# Patient Record
Sex: Female | Born: 1945 | Race: White | Hispanic: No | Marital: Married | Smoking: Never smoker
Health system: Southern US, Community
[De-identification: ages and names within clinical notes are randomized; demographics above are authoritative.]

## PROBLEM LIST (undated history)

## (undated) DIAGNOSIS — R509 Fever, unspecified: Secondary | ICD-10-CM

## (undated) DIAGNOSIS — E7800 Pure hypercholesterolemia, unspecified: Secondary | ICD-10-CM

## (undated) DIAGNOSIS — D72829 Elevated white blood cell count, unspecified: Secondary | ICD-10-CM

## (undated) DIAGNOSIS — R6883 Chills (without fever): Secondary | ICD-10-CM

## (undated) DIAGNOSIS — K922 Gastrointestinal hemorrhage, unspecified: Secondary | ICD-10-CM

## (undated) DIAGNOSIS — N63 Unspecified lump in unspecified breast: Secondary | ICD-10-CM

## (undated) DIAGNOSIS — N61 Mastitis without abscess: Secondary | ICD-10-CM

## (undated) DIAGNOSIS — A0472 Enterocolitis due to Clostridium difficile, not specified as recurrent: Secondary | ICD-10-CM

## (undated) DIAGNOSIS — R197 Diarrhea, unspecified: Secondary | ICD-10-CM

## (undated) DIAGNOSIS — I10 Essential (primary) hypertension: Secondary | ICD-10-CM

## (undated) DIAGNOSIS — T148XXA Other injury of unspecified body region, initial encounter: Secondary | ICD-10-CM

## (undated) DIAGNOSIS — Z9289 Personal history of other medical treatment: Secondary | ICD-10-CM

## (undated) HISTORY — PX: OTHER SURGICAL HISTORY: SHX169

## (undated) HISTORY — DX: Unspecified lump in unspecified breast: N63.0

## (undated) HISTORY — DX: Chills (without fever): R68.83

## (undated) HISTORY — DX: Pure hypercholesterolemia, unspecified: E78.00

## (undated) HISTORY — DX: Personal history of other medical treatment: Z92.89

## (undated) HISTORY — DX: Elevated white blood cell count, unspecified: D72.829

## (undated) HISTORY — DX: Essential (primary) hypertension: I10

## (undated) HISTORY — DX: Enterocolitis due to Clostridium difficile, not specified as recurrent: A04.72

## (undated) HISTORY — DX: Fever, unspecified: R50.9

## (undated) HISTORY — DX: Mastitis without abscess: N61.0

## (undated) HISTORY — DX: Diarrhea, unspecified: R19.7

## (undated) HISTORY — PX: ROTATOR CUFF REPAIR: SHX139

## (undated) HISTORY — DX: Other injury of unspecified body region, initial encounter: T14.8XXA

---

## 1999-04-01 ENCOUNTER — Other Ambulatory Visit: Admission: RE | Admit: 1999-04-01 | Discharge: 1999-04-01 | Payer: Self-pay | Admitting: Gynecology

## 2000-04-06 ENCOUNTER — Other Ambulatory Visit: Admission: RE | Admit: 2000-04-06 | Discharge: 2000-04-06 | Payer: Self-pay | Admitting: Gynecology

## 2001-04-26 ENCOUNTER — Inpatient Hospital Stay (HOSPITAL_COMMUNITY): Admission: EM | Admit: 2001-04-26 | Discharge: 2001-04-29 | Payer: Self-pay | Admitting: Emergency Medicine

## 2001-05-06 ENCOUNTER — Other Ambulatory Visit: Admission: RE | Admit: 2001-05-06 | Discharge: 2001-05-06 | Payer: Self-pay | Admitting: Gynecology

## 2002-05-02 ENCOUNTER — Other Ambulatory Visit: Admission: RE | Admit: 2002-05-02 | Discharge: 2002-05-02 | Payer: Self-pay | Admitting: Gynecology

## 2003-05-23 ENCOUNTER — Other Ambulatory Visit: Admission: RE | Admit: 2003-05-23 | Discharge: 2003-05-23 | Payer: Self-pay | Admitting: Gynecology

## 2003-07-07 ENCOUNTER — Ambulatory Visit (HOSPITAL_COMMUNITY): Admission: RE | Admit: 2003-07-07 | Discharge: 2003-07-07 | Payer: Self-pay | Admitting: Gynecology

## 2003-08-23 ENCOUNTER — Ambulatory Visit (HOSPITAL_COMMUNITY): Admission: RE | Admit: 2003-08-23 | Discharge: 2003-08-23 | Payer: Self-pay | Admitting: Gynecology

## 2004-01-25 ENCOUNTER — Ambulatory Visit (HOSPITAL_COMMUNITY): Admission: RE | Admit: 2004-01-25 | Discharge: 2004-01-25 | Payer: Self-pay | Admitting: Gynecology

## 2004-05-23 ENCOUNTER — Other Ambulatory Visit: Admission: RE | Admit: 2004-05-23 | Discharge: 2004-05-23 | Payer: Self-pay | Admitting: Gynecology

## 2004-06-24 ENCOUNTER — Ambulatory Visit (HOSPITAL_COMMUNITY): Admission: RE | Admit: 2004-06-24 | Discharge: 2004-06-24 | Payer: Self-pay | Admitting: Gynecology

## 2005-06-02 ENCOUNTER — Other Ambulatory Visit: Admission: RE | Admit: 2005-06-02 | Discharge: 2005-06-02 | Payer: Self-pay | Admitting: Gynecology

## 2005-06-12 ENCOUNTER — Ambulatory Visit (HOSPITAL_COMMUNITY): Admission: RE | Admit: 2005-06-12 | Discharge: 2005-06-12 | Payer: Self-pay | Admitting: Gynecology

## 2005-08-18 ENCOUNTER — Ambulatory Visit (HOSPITAL_COMMUNITY): Admission: RE | Admit: 2005-08-18 | Discharge: 2005-08-18 | Payer: Self-pay | Admitting: Gynecology

## 2006-01-19 ENCOUNTER — Ambulatory Visit (HOSPITAL_COMMUNITY): Admission: RE | Admit: 2006-01-19 | Discharge: 2006-01-19 | Payer: Self-pay | Admitting: Gynecology

## 2006-06-04 ENCOUNTER — Other Ambulatory Visit: Admission: RE | Admit: 2006-06-04 | Discharge: 2006-06-04 | Payer: Self-pay | Admitting: Gynecology

## 2006-07-30 ENCOUNTER — Ambulatory Visit (HOSPITAL_COMMUNITY): Admission: RE | Admit: 2006-07-30 | Discharge: 2006-07-30 | Payer: Self-pay | Admitting: Gynecology

## 2007-02-17 ENCOUNTER — Emergency Department: Payer: Self-pay | Admitting: Emergency Medicine

## 2007-05-14 ENCOUNTER — Ambulatory Visit (HOSPITAL_BASED_OUTPATIENT_CLINIC_OR_DEPARTMENT_OTHER): Admission: RE | Admit: 2007-05-14 | Discharge: 2007-05-14 | Payer: Self-pay | Admitting: *Deleted

## 2007-05-14 DIAGNOSIS — T148XXA Other injury of unspecified body region, initial encounter: Secondary | ICD-10-CM

## 2007-05-14 HISTORY — DX: Other injury of unspecified body region, initial encounter: T14.8XXA

## 2007-07-08 ENCOUNTER — Other Ambulatory Visit: Admission: RE | Admit: 2007-07-08 | Discharge: 2007-07-08 | Payer: Self-pay | Admitting: Gynecology

## 2010-01-03 ENCOUNTER — Ambulatory Visit: Payer: Self-pay | Admitting: Cardiology

## 2010-03-10 ENCOUNTER — Encounter: Payer: Self-pay | Admitting: Gynecology

## 2010-06-14 ENCOUNTER — Encounter: Payer: Self-pay | Admitting: *Deleted

## 2010-06-14 DIAGNOSIS — E7800 Pure hypercholesterolemia, unspecified: Secondary | ICD-10-CM | POA: Insufficient documentation

## 2010-06-14 DIAGNOSIS — M810 Age-related osteoporosis without current pathological fracture: Secondary | ICD-10-CM | POA: Insufficient documentation

## 2010-06-19 ENCOUNTER — Telehealth: Payer: Self-pay | Admitting: *Deleted

## 2010-06-19 ENCOUNTER — Other Ambulatory Visit (INDEPENDENT_AMBULATORY_CARE_PROVIDER_SITE_OTHER): Payer: Medicare Other | Admitting: *Deleted

## 2010-06-19 ENCOUNTER — Encounter: Payer: Self-pay | Admitting: Cardiology

## 2010-06-19 ENCOUNTER — Ambulatory Visit (INDEPENDENT_AMBULATORY_CARE_PROVIDER_SITE_OTHER): Payer: Medicare Other | Admitting: Cardiology

## 2010-06-19 VITALS — BP 120/70 | HR 66 | Wt 134.0 lb

## 2010-06-19 DIAGNOSIS — E7800 Pure hypercholesterolemia, unspecified: Secondary | ICD-10-CM

## 2010-06-19 LAB — BASIC METABOLIC PANEL
BUN: 13 mg/dL (ref 6–23)
CO2: 28 mEq/L (ref 19–32)
Chloride: 105 mEq/L (ref 96–112)
Creatinine, Ser: 0.5 mg/dL (ref 0.4–1.2)
Glucose, Bld: 86 mg/dL (ref 70–99)
Potassium: 4 mEq/L (ref 3.5–5.1)

## 2010-06-19 LAB — LIPID PANEL
Cholesterol: 154 mg/dL (ref 0–200)
HDL: 63.6 mg/dL (ref >39.00)
LDL Cholesterol: 80 mg/dL (ref 0–99)
Total CHOL/HDL Ratio: 2
Triglycerides: 53 mg/dL (ref 0.0–149.0)
VLDL: 10.6 mg/dL (ref 0.0–40.0)

## 2010-06-19 LAB — HEPATIC FUNCTION PANEL
ALT: 21 U/L (ref 0–35)
AST: 27 U/L (ref 0–37)
Albumin: 4.1 g/dL (ref 3.5–5.2)
Alkaline Phosphatase: 62 U/L (ref 39–117)
Bilirubin, Direct: 0.1 mg/dL (ref 0.0–0.3)
Total Bilirubin: 0.8 mg/dL (ref 0.3–1.2)
Total Protein: 6.9 g/dL (ref 6.0–8.3)

## 2010-06-19 MED ORDER — SIMVASTATIN 40 MG PO TABS: 40.0000 mg | ORAL_TABLET | Freq: Every day | ORAL | Status: DC

## 2010-06-19 NOTE — Progress Notes (Signed)
Clelia Schaumann Date of Birth:  Nov 25, 1945 [facility] / [facility] [address]   [address] [place], [place]  [address] [phone]           Fax   [phone]  HPI: This pleasant 65 year old woman is seen for a scheduled six-month followup office visit.  She has a past history of hypercholesterolemia.  She does not have any history of known coronary disease.  She had a normal stress Cardiolite study on 10/19/03.  Her ejection fraction at that time was 61%.  She stays physically fit by playing competitive tennis several times a week.  She's not having any chest pain or shortness of breath.  She's had no dizziness or syncope or palpitations.  Current Outpatient Prescriptions  Medication Sig Dispense Refill  . aspirin 81 MG tablet Take 81 mg by mouth daily.        . calcium-vitamin D (OSCAL WITH D) 500-200 MG-UNIT per tablet Take 1 tablet by mouth daily.        . Cholecalciferol (VITAMIN D PO) Take 1,000 Int'l Units by mouth daily.        . Glucosamine-Chondroitin (GLUCOSAMINE CHONDR COMPLEX PO) Take by mouth daily.        Marland Kitchen GRAPE SEED EXTRACT PO Take by mouth daily.        . Multiple Vitamin (MULTIVITAMIN) tablet Take 1 tablet by mouth daily.        . Omega-3 Fatty Acids (FISH OIL) 1000 MG CAPS Take by mouth daily.        . simvastatin (ZOCOR) 40 MG tablet Take 1 tablet (40 mg total) by mouth at bedtime.  90 tablet  3  . DISCONTD: simvastatin (ZOCOR) 40 MG tablet Take 40 mg by mouth at bedtime.        . Alendronate-Cholecalciferol (FOSAMAX PLUS D PO) Take 70 mg by mouth once a week.          No Known Allergies  Patient Active Problem List  Diagnoses  . Hypercholesterolemia  . Osteoporosis    History  Smoking status  . Never Smoker   Smokeless tobacco  . Not on file    History  Alcohol Use     Family History  Problem Relation Age of Onset  . Heart disease Mother   . Diabetes Mother   . Heart disease Father     Review of Systems: The patient denies  any heat or cold intolerance.  No weight gain or weight loss.  The patient denies headaches or blurry vision.  There is no cough or sputum production.  The patient denies dizziness.  There is no hematuria or hematochezia.  The patient denies any muscle aches or arthritis.  The patient denies any rash.  The patient denies frequent falling or instability.  There is no history of depression or anxiety.  All other systems were reviewed and are negative.    Physical Exam: Filed Vitals:   06/19/10 1002  BP: 120/70  Pulse: 66  The general appearance reveals a well-developed well-nourished woman in no distress.Pupils equal and reactive.   Extraocular Movements are full.  There is no scleral icterus.  The mouth and pharynx are normal.  The neck is supple.  The carotids reveal no bruits.  The jugular venous pressure is normal.  The thyroid is not enlarged.  There is no lymphadenopathy.The chest is clear to percussion and auscultation. There are no rales or rhonchi. Expansion of the chest is symmetrical.The precordium is quiet.  The  first heart sound is normal.  The second heart sound is physiologically split.  There is no murmur gallop rub or click.  There is no abnormal lift or heave.The abdomen is soft and nontender. Bowel sounds are normal. The liver and spleen are not enlarged. There Are no abdominal masses. There are no bruits.The pedal pulses are good.  There is no phlebitis or edema.  There is no cyanosis or clubbing.Strength is normal and symmetrical in all extremities.  There is no lateralizing weakness.  There are no sensory deficits.    Assessment / Plan:  Blood work is pending.  Continue same medication.  Recheck in 6 months.

## 2010-06-19 NOTE — Assessment & Plan Note (Signed)
Misty Morales returns for a scheduled six-month followup office visit.  She has a history of hypercholesterolemia.   she is on simvastatin 40 mg daily.She is not having any myalgias or other side effects.  She denies any chest pain or shortness of breath.  She plays tennis regularly and has good physical stamina and physical fitness.

## 2010-06-19 NOTE — Telephone Encounter (Signed)
Adv pt of lab results 

## 2010-07-02 NOTE — Op Note (Signed)
[name], Misty NO.:  [id number]   MEDICAL RECORD NO.:  [id number]          PATIENT TYPE:  AMB   LOCATION:  DSC                          FACILITY:  [facility]   PHYSICIAN:  Tennis Must Meyerdierks, M.D.DATE OF BIRTH:  11/13/1945   DATE OF PROCEDURE:  05/14/2007  DATE OF DISCHARGE:                               OPERATIVE REPORT   PREOPERATIVE DIAGNOSIS:  Malunion, fracture, right ulna.   POSTOPERATIVE DIAGNOSIS:  Malunion, fracture, right ulna.   PROCEDURE:  Osteotomy with open reduction and internal fixation, right  ulna.   SURGEON:  Lowell Bouton, MD.   ASSISTANT:  Alvera Novel, MD.   ANESTHESIA:  Axillary block.   OPERATIVE FINDINGS:  The patient had a malunited ulna that was in the  mid to proximal third.  There was significant callus formation that  needed to be taken down to allow for pronation.  The fracture line was  still able to be identified from where the callus was.   PROCEDURE:  Under axillary block anesthesia with a tourniquet on the  right arm, the right hand was prepped and draped in the usual fashion  and after exsanguinating the limb, the tourniquet was inflated to 250  mmHg.  A longitudinal incision was made over the subcutaneous border of  the ulna and carried down through the subcutaneous tissues.  Bleeding  points were coagulated.  Sharp dissection was carried down to the bone  through the periosteum and a Therapist, nutritional was used to elevate the  periosteum.  A large amount of callus had formed and this was removed  with a rongeur and osteotomes.  The fracture appeared to be solid and  after removing callus and working with a rongeur, the fracture site  could be identified.  X-rays were used to assist with this.  The  fracture was then taken down and an osteotome was used to complete the  osteotomy.  Further callus was removed from the volar and dorsal aspect  and also radially.  The fracture was then reduced and a  seven-hole 3.5  mm DC plate from the AO small fragment set was applied to the  subcutaneous border of the ulna.  It was held in place with clamps.  X-  rays showed good alignment of the fracture and the patient had good  pronation and supination.  The plate was then applied after contouring  it with plate benders using 3.5-mm screws.  One of the screws was lagged  to compress the fracture site.  Six of the holes were filled with screws  and six cortices on either side of the fracture site were obtained.  The  seventh hole was left empty at the fracture site.  X-rays showed good  position.  The forearm appeared to be stable and had good mobility.  The  wound was irrigated copiously with saline.  The fascia was closed with 4-  0 Vicryl, the  subcutaneous tissue was closed over a vessel loop drain with 4-0 Vicryl.  The skin was closed with a 3-0 subcuticular Prolene.  Steri-Strips were  applied, followed by sterile dressings  and a posterior elbow splint.  The patient had the tourniquet released.  She went to the recovery room  awake and stable, in good condition.      Lowell Bouton, M.D.  Electronically Signed     EMM/MEDQ  D:  05/14/2007  T:  05/15/2007  Job:  [id number]

## 2010-07-05 NOTE — Discharge Summary (Signed)
[facility]  Patient:    Misty Morales, Misty Morales Visit Number: [id number] MRN: [id number]          Service Type: MED Location: 9B [id number] [id number] Attending Physician:  Jackie Plum Dictated by:   Jackie Plum, M.D. Admit Date:  04/26/2001 Discharge Date: 04/29/2001   CC:         Misty Morales, M.D.   Discharge Summary  DATE OF BIRTH:  1945-06-15.  DISCHARGE DIAGNOSES: 1. Right breast mastitis with septicemia, resolving.  Patient clinically    stable on discharge. 2. Leukocytosis probably secondary to #1, improved, needs outpatient follow-up. 3. Diarrhea.    a. Clostridium difficile toxin negative, patient started on Flagyl for       presumptive Clostridium difficile colitis, resolving.  No evidence of       clinical dehydration during hospitalization and at time of discharge.  DISCHARGE MEDICATIONS: 1. Flagyl 500 mg p.o. t.i.d. for eight more days. 2. Augmentin 875 mg p.o. b.i.d. for 10 days. 3. Restoril 15 mg p.o. q.h.s. p.r.n.  Patient declines a prescription for Vicodin.  She has enough prescriptions at home, which was prescribed prior to admission.  DISCHARGE LABORATORY DATA:  WBC count of 15.2, hemoglobin of 10.4, hematocrit of 30.5, MCV of 90.1, platelet count of 256.  Sodium 140, potassium 3.5, chloride 106, bicarbonate 29, glucose 96, BUN 9, creatinine 0.9.  PROCEDURES:  Not applicable.  CONSULTANTS:  Not applicable.  CONDITION ON DISCHARGE:  Improved.  ACTIVITY:  As tolerated.  DIET:  Regular diet.  SPECIAL INSTRUCTIONS:  The patient has been instructed to report to M.D. if her breast pain worsens or persists.  She is also to report to M.D. if she experiences any persistent fever, chills, or dizziness.  FOLLOW-UP:  Follow-up appointment will be with her primary care physician, Misty Morales, in one week.  The patient is to call for appointment on discharge.  HISTORY OF PRESENT ILLNESS:  Misty Morales presented with a history of  breast pain and swelling involving the right breast as well as severe chills on April 26, 2001, of three days duration.  The patient and her husband had bought a tub about three weeks prior to admission and about two weeks prior to admission while they were learning how to manipulate the hot tub to the water pH, they developed a skin rash after using the hot tub.  The patient reported a pH that was very high.  Two days thereafter she noted a small boil under the right nipple.  About three days prior to admission she noticed some tenderness and pain with erythema involving the central lower quadrant of the right breast, for which she received Rocephin one shot and subsequently placed on Clindamycin at [facility].  At time of evaluation at Urgent Care, her WBC count was 11,000, had a fever of about 102 at home.  She did not give any history of nipple discharge, diarrhea, nausea, vomiting, or change in her usual mentation.  There was no history of shortness of breath, hemoptysis, or night sweats.  Physical examination at time of admission was notable for a temperature of 99.0 with a heart rate of 95 and respiratory rate of 18, and blood pressure was 103/64.  Breast exam showed that her right breast had diffuse erythema affecting mostly her right lower outer quadrant.  There was no nipple discharge.  There was no definite lymphadenopathy or fluctuance. The patient was therefore subsequently admitted for right breast mastitis with septicemia.  HOSPITAL COURSE:  #1 - RIGHT BREAST MASTITIS WITH SEPTICEMIA:  On account of history involving the use of hot tub, the patient was covered with Zosyn to cover probable pseudomonal etiology and was also started on good analgesic control.  The patient improved subsequently with decrement in her admitting WBC count of 17,800 to 15,200 by time of discharge.  Her temperature during hospitalization remained fairly all right except for a fever spike of  100.5 on the second day of admission.  The patients IV antibiotics were changed to p.o. Augmentin without any fever spike within 24 hours of doing so.  The patient is going to be discharged home to continue 10 more days of antibiotics.  We recommend that patient gets outpatient follow-up in one week with her primary care physician to evaluate the breast induration and mastitis, and should there be any problems with the resolution of this pathology, recommend that she gets a breast ultrasound to rule out any incipient breast abscess.  #2 -  LEUKOCYTOSIS:  The patient had a leukocytosis of 17,000+ on admission as mentioned above.  Discharge leukocyte count is 15,200.  This was taken about two days ago.  We expect this to be lower on repeat during her outpatient visit, and it is likely secondary to problem list #1.  #3 -  DIARRHEA:  The patient had an episode of diarrhea in hospital.  We thought this might be related to C. difficile colitis in view of her previous antibiotic treatment with Clindamycin and subsequent Zosyn.  C. difficile toxin was negative, and we put her on Flagyl for presumptive C. difficile colitis, which improved her diarrheal symptoms.  She was not clinically dehydrated during hospitalization and at the time of discharge.  She has Imodium at home, and she will use it as necessary.  She is not to use more than eight tablets of 2 mg tablets over 24 hours, and she expressed understanding.  FOLLOW-UP:  Follow-up appointment will be with the patients primary care physician, Misty Morales, in one week.  She will call for appointment. Dictated by:   Jackie Plum, M.D. Attending Physician:  Jackie Plum DD:  04/29/01 TD:  05/01/01 Job: [id number]

## 2010-07-05 NOTE — H&P (Signed)
[facility]  Patient:    Misty Morales, PUTZIER Visit Number: [id number] MRN: [id number]          Service Type: MED Location: 4N 8295 01 Attending Physician:  Jackie Plum Dictated by:   Rosanne Sack, M.D. Admit Date:  04/26/2001   CC:         Charles A. Tenny Craw, M.D.   History and Physical  DATE OF BIRTH:  [birth year]  PROBLEM LIST:  Right mastitis with septicemia.  CHIEF COMPLAINT:  Fever, chills, right breast swelling, and pain.  HISTORY OF PRESENT ILLNESS:  Misty Morales is a very pleasant 65 year old female who presents with a three-day history of fevers, chills, and worsening right breast erythema.  The patient and her husband bought a hot tub about three weeks ago.  About two weeks ago while they were learning how to manage the hot tub water pH, they developed a skin rash after using this hot tub.  The patient reported a pH that was very high.  Two days later she noticed a small boil under the right nipple.  About three days ago and after playing tennis, she noticed tenderness and pain.  On self-inspection that took place about three days ago the patient noticed erythema affecting the nipple along with the external lower quadrant of the breast.  The patient went into [facility] where she received a shot of Rocephin.  She was placed on clindamycin.  At that time, her white cell count was 11,000.  Despite this therapy, the patients erythema continued progressing.  She also has developed fever and chills.  She reports a fever of 102.  The patient was sent to the hospital for further evaluation and received intravenous antibiotic therapy. The patient describes fever and chills.  No diarrhea, nausea, or vomiting.  No altered mental status.  No chest pain.  No shortness of breath.  No hemoptysis.  No night sweats.  No focal weakness or swallowing problems.  No melena, tarry stools, or bright red blood per rectum.  The patient  describes ______ pain and tenderness.  No nipple discharge.  The patient describes how the erythema has worsened over the last three to five days despite empiric antibiotic therapy described previously.  No weight loss. No cough.  No hemoptysis or hematemesis.  PAST MEDICAL HISTORY:  As in problem list.  ALLERGIES:  None.  MEDICATIONS:  Clindamycin 300 mg p.o. q.6h.  SOCIAL HISTORY:  The patient is married.  No children.  She is retired.  No smoking.  Occasional alcohol use.  FAMILY HISTORY:  Noncontributory.  REVIEW OF SYSTEMS:  As in HPI.  PHYSICAL EXAMINATION:  VITAL SIGNS:  Temperature 99.0, heart rate 95, respiratory rate 18, blood pressure 123/67.  HEENT:  Normocephalic, atraumatic.  Nonicteric sclerae.  Conjunctivae within the normal limits.  PERRLA.  EOMI.  Funduscopic exam negative for papilledema or hemorrhage.  TMs within the normal limits.  Moist mucous membranes. Oropharynx clear.  NECK:  Supple.  No JVD, no bruits, no adenopathy, no thyromegaly.  LUNGS:  Clear to auscultation bilaterally.  Without crackles, wheezes.  Fair air movement bilaterally.  BREASTS:  The right breast has diffuse erythema affecting mostly the right lower sternal quadrant.  No nipple discharge.  There is induration of the affected area though no fluctuance.  There is no definitive adenopathy.  There is also erythema affecting the lower chest below the right breast as well as the right upper chest into the axilla.  CARDIOVASCULAR:  Regular rate  and rhythm.  Without murmurs, rubs, or gallops. Normal S1, S2.  ABDOMEN:  Flat, nontender, nondistended.  Bowel sounds were present.  No hepatosplenomegaly.  GENITOURINARY:  Within normal limits.  RECTAL:  Deferred.  EXTREMITIES:  No edema, clubbing, or cyanosis.  Pulses 2+ bilaterally.  NEUROLOGIC:  Alert and oriented x3.  Strength 5/5 in all extremities.  DTRs 3/5 in all extremities.  Cranial nerves II-XII intact.  Sensory  intact. Plantar reflexes downgoing bilaterally.  LABORATORY DATA:  WBC 21,000, obtained in [facility].  ASSESSMENT AND PLAN:  Right mastitis associated with septicemia:  Given the progression of the skin rash that is consistent with mastitis along with fever of 102 prior to admission and worsening of the white cell count from 11,000 to 21,000, the patient is being admitted due to concerns of septicemia.  The origin of the mastitis seems to be induced by the hot tub after developing the general skin rash.  I suspect a superimposed bacterial infection. Staphylococcus aureus is the most common microorganism associated with mastitis though, given the concurrent hot tub exposure, other microorganisms like Pseudomonas, bacteroides species, as well as ______ Streptococcus need to be considered.  Our plan is to admit the patient to a regular bed. Intravenous Zosyn will be started.  No blood cultures were obtained since the patient was taking concurrent antibiotic therapy prior to admission. Currently there is no evidence of abscess on exam.  If the patient does not improve or if fluctuance is noticed on exam in the next 24-48 hours, an ultrasound of the right breast will be obtained to rule out abscess.  Will also recheck another CBC in the morning to follow on the leukocytosis. Dictated by:   Rosanne Sack, M.D. Attending Physician:  Jackie Plum DD:  04/26/01 TD:  04/27/01 Job: [id number]

## 2010-10-11 ENCOUNTER — Telehealth: Payer: Self-pay | Admitting: Cardiology

## 2010-10-11 NOTE — Telephone Encounter (Signed)
Left message for patient:  Need to reschedule 01/07/2011 appointment.

## 2010-11-11 LAB — POCT HEMOGLOBIN-HEMACUE: Hemoglobin: 13.5

## 2010-12-27 ENCOUNTER — Other Ambulatory Visit: Payer: Self-pay | Admitting: *Deleted

## 2010-12-27 DIAGNOSIS — E7800 Pure hypercholesterolemia, unspecified: Secondary | ICD-10-CM

## 2011-01-02 ENCOUNTER — Other Ambulatory Visit (INDEPENDENT_AMBULATORY_CARE_PROVIDER_SITE_OTHER): Payer: Medicare Other | Admitting: *Deleted

## 2011-01-02 DIAGNOSIS — E7800 Pure hypercholesterolemia, unspecified: Secondary | ICD-10-CM

## 2011-01-02 LAB — LIPID PANEL
Total CHOL/HDL Ratio: 2
VLDL: 11.2 mg/dL (ref 0.0–40.0)

## 2011-01-02 LAB — BASIC METABOLIC PANEL
CO2: 29 mEq/L (ref 19–32)
Chloride: 107 mEq/L (ref 96–112)
Glucose, Bld: 87 mg/dL (ref 70–99)
Potassium: 4 mEq/L (ref 3.5–5.1)
Sodium: 142 mEq/L (ref 135–145)

## 2011-01-02 LAB — HEPATIC FUNCTION PANEL
AST: 31 U/L (ref 0–37)
Albumin: 4.1 g/dL (ref 3.5–5.2)
Alkaline Phosphatase: 75 U/L (ref 39–117)
Total Protein: 7 g/dL (ref 6.0–8.3)

## 2011-01-06 ENCOUNTER — Encounter: Payer: Self-pay | Admitting: Cardiology

## 2011-01-06 ENCOUNTER — Ambulatory Visit (INDEPENDENT_AMBULATORY_CARE_PROVIDER_SITE_OTHER): Payer: Medicare Other | Admitting: Cardiology

## 2011-01-06 ENCOUNTER — Other Ambulatory Visit: Payer: Medicare Other | Admitting: *Deleted

## 2011-01-06 VITALS — BP 126/88 | HR 78 | Ht 63.0 in | Wt 139.0 lb

## 2011-01-06 DIAGNOSIS — E7800 Pure hypercholesterolemia, unspecified: Secondary | ICD-10-CM

## 2011-01-06 DIAGNOSIS — I119 Hypertensive heart disease without heart failure: Secondary | ICD-10-CM

## 2011-01-06 NOTE — Patient Instructions (Signed)
Your physician wants you to follow-up in: 6 months with fasting labs You will receive a reminder letter in the mail two months in advance. If you don't receive a letter, please call our office to schedule the follow-up appointment.  Watch your salt in diet.  Monitor your blood pressure at home and if it stays borderline high, call us. Sodium-Controlled Diet Sodium is a mineral. It is found in many foods. Sodium may be found naturally or added during the making of a food. The most common form of sodium is salt, which is made up of sodium and chloride. Reducing your sodium intake involves changing your eating habits. The following guidelines will help you reduce the sodium in your diet:  Stop using the salt shaker.   Use salt sparingly in cooking and baking.   Substitute with sodium-free seasonings and spices.   Do not use a salt substitute (potassium chloride) without your caregiver's permission.   Include a variety of fresh, unprocessed foods in your diet.   Limit the use of processed and convenience foods that are high in sodium.  USE THE FOLLOWING FOODS SPARINGLY: Breads/Starches  Commercial bread stuffing, commercial pancake or waffle mixes, coating mixes. Waffles. Croutons. Prepared (boxed or frozen) potato, rice, or noodle mixes that contain salt or sodium. Salted [place] fries or hash browns. Salted popcorn, breads, crackers, chips, or snack foods.  Vegetables  Vegetables canned with salt or prepared in cream, butter, or cheese sauces. Sauerkraut. Tomato or vegetable juices canned with salt.   Fresh vegetables are allowed if rinsed thoroughly.  Fruit  Fruit is okay to eat.  Meat and Meat Substitutes  Salted or smoked meats, such as bacon or Canadian bacon, chipped or corned beef, hot dogs, salt pork, luncheon meats, pastrami, ham, or sausage. Canned or smoked fish, poultry, or meat. Processed cheese or cheese spreads, blue or Roquef[place] cheese. Battered or frozen fish products.  Prepared spaghetti sauce. Baked beans. Reuben sandwiches. Salted nuts. Caviar.  Milk  Limit buttermilk to 1 cup per week.  Soups and Combination Foods  Bouillon cubes, canned or dried soups, broth, consomm. Convenience (frozen or packaged) dinners with more than 600 mg sodium. Pot pies, pizza, Asian food, fast food cheeseburgers, and specialty sandwiches.  Desserts and Sweets  Regular (salted) desserts, pie, commercial fruit snack pies, commercial snack cakes, canned puddings.   Eat desserts and sweets in moderation.  Fats and Oils  Gravy mixes or canned gravy. No more than 1 to 2 tbs of salad dressing. Chip dips.   Eat fats and oils in moderation.  Beverages  See those listed under the vegetables and milk groups.  Condiments  Ketchup, mustard, meat sauces, salsa, regular (salted) and lite soy sauce or mustard. Dill pickles, olives, meat tenderizer. Prepared horseradish or pickle relish. Dutch-processed cocoa. Baking powder or baking soda used medicinally. Worcestershire sauce. "Light" salt. Salt substitute, unless approved by your caregiver.  Document Released: 07/26/2001 Document Revised: 10/16/2010 Document Reviewed: 02/26/2009 [facility] Patient Information 2012 [place], [place].Monitor your blood pressure at home and if it remains borderline high, call back.

## 2011-01-06 NOTE — Progress Notes (Signed)
Misty Morales Date of Birth:  05/12/1945 [facility] / [facility] [address]   [address] [place], [place]  [address] [phone]           Fax   [phone]  HPI: This pleasant 65 year old woman is seen for a scheduled 6 month followup office visit as a past history of hypercholesterolemia.  He is on simvastatin 40 mg daily.  She does not have any history of ischemic heart disease.  She had a normal stress Cardiolite study on 10/19/03.  Ejection fraction was 61%.  Current Outpatient Prescriptions  Medication Sig Dispense Refill  . aspirin 81 MG tablet Take 81 mg by mouth daily.        . calcium-vitamin D (OSCAL WITH D) 500-200 MG-UNIT per tablet Take 1 tablet by mouth daily.        . Cholecalciferol (VITAMIN D PO) Take 1,000 Int'l Units by mouth daily.        . Glucosamine-Chondroitin (GLUCOSAMINE CHONDR COMPLEX PO) Take by mouth daily.        Marland Kitchen GRAPE SEED EXTRACT PO Take by mouth daily.        . Multiple Vitamin (MULTIVITAMIN) tablet Take 1 tablet by mouth daily.        . Omega-3 Fatty Acids (FISH OIL) 1000 MG CAPS Take by mouth daily.        . simvastatin (ZOCOR) 40 MG tablet Take 1 tablet (40 mg total) by mouth at bedtime.  90 tablet  3    No Known Allergies  Patient Active Problem List  Diagnoses  . Hypercholesterolemia  . Osteoporosis    History  Smoking status  . Never Smoker   Smokeless tobacco  . Not on file    History  Alcohol Use  . Yes    Family History  Problem Relation Age of Onset  . Heart disease Mother   . Diabetes Mother   . Heart disease Father     Review of Systems: The patient denies any heat or cold intolerance.  No weight gain or weight loss.  The patient denies headaches or blurry vision.  There is no cough or sputum production.  The patient denies dizziness.  There is no hematuria or hematochezia.  The patient denies any muscle aches or arthritis.  The patient denies any rash.  The patient denies frequent falling or  instability.  There is no history of depression or anxiety.  All other systems were reviewed and are negative.   Physical Exam: Filed Vitals:   01/06/11 1400  BP: 126/88  Pulse: 78  The head and neck exam reveals pupils equal and reactive.  Extraocular movements are full.  There is no scleral icterus.  The mouth and pharynx are normal.  The neck is supple.  The carotids reveal no bruits.  The jugular venous pressure is normal.  The  thyroid is not enlarged.  There is no lymphadenopathy.  The chest is clear to percussion and auscultation.  There are no rales or rhonchi.  Expansion of the chest is symmetrical.  The precordium is quiet.  The first heart sound is normal.  The second heart sound is physiologically split.  There is no murmur gallop rub or click.  There is no abnormal lift or heave.  The abdomen is soft and nontender.  The bowel sounds are normal.  The liver and spleen are not enlarged.  There are no abdominal masses.  There are no abdominal bruits.  Extremities reveal good pedal pulses.  There is no phlebitis or edema.  There is no cyanosis or clubbing.  Strength is normal and symmetrical in all extremities.  There is no lateralizing weakness.  There are no sensory deficits.  The skin is warm and dry.  There is no rash.      Assessment / Plan: Continue same medication.  Monitor blood pressure at home and let us know if it stays high.  Limit dietary salt.  Continue regular tennis exercise.  Recheck in 6 months for followup office visit and fasting lab work, or sooner when necessary

## 2011-01-06 NOTE — Assessment & Plan Note (Signed)
Her blood pressure is a little high today.  I checked it twice, once sitting in one slowing down.  Diastolic is still 88.  She did have a stressful weekend.  A good friend from church died unexpectedly over the weekend.  She has not had any signs or symptoms from high blood pressure.  She has a blood pressure cuff at home and she will monitor it and let us know if it stays high.  He is on no blood pressure medicine at this time.

## 2011-01-06 NOTE — Assessment & Plan Note (Signed)
The patient has a history of hypercholesterolemia.  We reviewed her labs from last week.  Her lipids are excellent on current therapy.  She is not having any side effects from the statin therapy

## 2011-01-07 ENCOUNTER — Ambulatory Visit: Payer: Medicare Other | Admitting: Cardiology

## 2011-06-05 DIAGNOSIS — Z1212 Encounter for screening for malignant neoplasm of rectum: Secondary | ICD-10-CM | POA: Diagnosis not present

## 2011-06-05 DIAGNOSIS — Z130 Encounter for screening for diseases of the blood and blood-forming organs and certain disorders involving the immune mechanism: Secondary | ICD-10-CM | POA: Diagnosis not present

## 2011-06-05 DIAGNOSIS — Z1231 Encounter for screening mammogram for malignant neoplasm of breast: Secondary | ICD-10-CM | POA: Diagnosis not present

## 2011-06-05 DIAGNOSIS — Z124 Encounter for screening for malignant neoplasm of cervix: Secondary | ICD-10-CM | POA: Diagnosis not present

## 2011-06-05 DIAGNOSIS — Z01419 Encounter for gynecological examination (general) (routine) without abnormal findings: Secondary | ICD-10-CM | POA: Diagnosis not present

## 2011-06-11 DIAGNOSIS — M810 Age-related osteoporosis without current pathological fracture: Secondary | ICD-10-CM | POA: Diagnosis not present

## 2011-06-11 DIAGNOSIS — R319 Hematuria, unspecified: Secondary | ICD-10-CM | POA: Diagnosis not present

## 2011-06-11 DIAGNOSIS — Z13820 Encounter for screening for osteoporosis: Secondary | ICD-10-CM | POA: Diagnosis not present

## 2011-06-11 DIAGNOSIS — N390 Urinary tract infection, site not specified: Secondary | ICD-10-CM | POA: Diagnosis not present

## 2011-06-16 DIAGNOSIS — R319 Hematuria, unspecified: Secondary | ICD-10-CM | POA: Diagnosis not present

## 2011-06-23 ENCOUNTER — Other Ambulatory Visit (INDEPENDENT_AMBULATORY_CARE_PROVIDER_SITE_OTHER): Payer: Medicare Other

## 2011-06-23 DIAGNOSIS — E7800 Pure hypercholesterolemia, unspecified: Secondary | ICD-10-CM

## 2011-06-23 LAB — HEPATIC FUNCTION PANEL
ALT: 21 U/L (ref 0–35)
AST: 26 U/L (ref 0–37)
Bilirubin, Direct: 0 mg/dL (ref 0.0–0.3)
Total Bilirubin: 0.6 mg/dL (ref 0.3–1.2)

## 2011-06-23 LAB — LIPID PANEL
LDL Cholesterol: 89 mg/dL (ref 0–99)
Total CHOL/HDL Ratio: 3
Triglycerides: 93 mg/dL (ref 0.0–149.0)
VLDL: 18.6 mg/dL (ref 0.0–40.0)

## 2011-06-23 LAB — BASIC METABOLIC PANEL
BUN: 12 mg/dL (ref 6–23)
GFR: 88.38 mL/min (ref >60.00)
Potassium: 3.9 mEq/L (ref 3.5–5.1)

## 2011-06-23 NOTE — Progress Notes (Signed)
Quick Note:  Please make copy of labs for patient visit. ______ 

## 2011-06-25 ENCOUNTER — Ambulatory Visit (INDEPENDENT_AMBULATORY_CARE_PROVIDER_SITE_OTHER): Payer: Medicare Other | Admitting: Cardiology

## 2011-06-25 ENCOUNTER — Encounter: Payer: Self-pay | Admitting: Cardiology

## 2011-06-25 VITALS — BP 122/77 | HR 79 | Ht 63.0 in | Wt 133.0 lb

## 2011-06-25 DIAGNOSIS — E7800 Pure hypercholesterolemia, unspecified: Secondary | ICD-10-CM | POA: Diagnosis not present

## 2011-06-25 DIAGNOSIS — I119 Hypertensive heart disease without heart failure: Secondary | ICD-10-CM

## 2011-06-25 NOTE — Progress Notes (Signed)
Misty Morales Date of Birth:  09-23-1945 [facility] [address] [place], [place]  [address] [phone]  Fax   [phone]  HPI: This pleasant 66 year old woman is seen for a six-month followup office visit.  He has a past history of mild essential hypertension controlled with diet and exercise and a past history of hypercholesterolemia.  He has been in good general health.  She has not been experiencing any new symptoms recently regarding her heart.  She did have a routine GYN exam recently at which time microscopic hematuria was found.  Her gynecologist referred her to Dr. Earlene Plater who plans to do an ultrasound soon.  The patient has not had any symptoms and she's had no gross hematuria.  Current Outpatient Prescriptions  Medication Sig Dispense Refill  . aspirin 81 MG tablet Take 81 mg by mouth daily.        . calcium-vitamin D (OSCAL WITH D) 500-200 MG-UNIT per tablet Take 1 tablet by mouth daily. Taking 750 bid      . Cholecalciferol (VITAMIN D PO) Take 1,000 Int'l Units by mouth daily.        . Glucosamine-Chondroitin (GLUCOSAMINE CHONDR COMPLEX PO) Take by mouth daily.        Marland Kitchen GRAPE SEED EXTRACT PO Take by mouth daily.        . Multiple Vitamin (MULTIVITAMIN) tablet Take 1 tablet by mouth daily.        . Omega-3 Fatty Acids (FISH OIL) 1000 MG CAPS Take by mouth daily.        . simvastatin (ZOCOR) 40 MG tablet Take 1 tablet (40 mg total) by mouth at bedtime.  90 tablet  3    No Known Allergies  Patient Active Problem List  Diagnoses  . Hypercholesterolemia  . Osteoporosis  . Benign hypertensive heart disease without heart failure    History  Smoking status  . Never Smoker   Smokeless tobacco  . Not on file    History  Alcohol Use  . Yes    Family History  Problem Relation Age of Onset  . Heart disease Mother   . Diabetes Mother   . Heart disease Father     Review of Systems: The patient denies any heat or cold intolerance.  No  weight gain or weight loss.  The patient denies headaches or blurry vision.  There is no cough or sputum production.  The patient denies dizziness.  There is no hematuria or hematochezia.  The patient denies any muscle aches or arthritis.  The patient denies any rash.  The patient denies frequent falling or instability.  There is no history of depression or anxiety.  All other systems were reviewed and are negative.   Physical Exam: Filed Vitals:   06/25/11 1037  BP: 122/77  Pulse: 79   the general appearance reveals a healthy-appearing middle-aged woman in no distress.The head and neck exam reveals pupils equal and reactive.  Extraocular movements are full.  There is no scleral icterus.  The mouth and pharynx are normal.  The neck is supple.  The carotids reveal no bruits.  The jugular venous pressure is normal.  The  thyroid is not enlarged.  There is no lymphadenopathy.  The chest is clear to percussion and auscultation.  There are no rales or rhonchi.  Expansion of the chest is symmetrical.  The precordium is quiet.  The first heart sound is normal.  The second heart sound is physiologically split.  There is no  murmur gallop rub or click.  There is no abnormal lift or heave.  The abdomen is soft and nontender.  The bowel sounds are normal.  The liver and spleen are not enlarged.  There are no abdominal masses.  There are no abdominal bruits.  Extremities reveal good pedal pulses.  There is no phlebitis or edema.  There is no cyanosis or clubbing.  Strength is normal and symmetrical in all extremities.  There is no lateralizing weakness.  There are no sensory deficits.  The skin is warm and dry.  There is no rash.      Assessment / Plan: Continue same medication and be rechecked in 6 months for followup office visit and fasting lipid panel hepatic function panel and basal metabolic panel

## 2011-06-25 NOTE — Assessment & Plan Note (Signed)
Blood pressure remaining stable on salt restriction and she plays tennis regularly for exercise

## 2011-06-25 NOTE — Patient Instructions (Signed)
Your physician recommends that you continue on your current medications as directed. Please refer to the Current Medication list given to you today.  Your physician wants you to follow-up in: 6 month. You will receive a reminder letter in the mail two months in advance. If you don't receive a letter, please call our office to schedule the follow-up appointment.  

## 2011-06-25 NOTE — Assessment & Plan Note (Signed)
Her blood cholesterol is higher this time but still acceptable.  The patient is not aware of any significant change in her diet and she has actually lost 6 pounds since last visit.  We will have her continue current dose of simvastatin

## 2011-07-10 DIAGNOSIS — R319 Hematuria, unspecified: Secondary | ICD-10-CM | POA: Diagnosis not present

## 2011-08-15 ENCOUNTER — Other Ambulatory Visit: Payer: Self-pay | Admitting: Cardiology

## 2011-10-13 DIAGNOSIS — R319 Hematuria, unspecified: Secondary | ICD-10-CM | POA: Diagnosis not present

## 2011-10-16 DIAGNOSIS — D090 Carcinoma in situ of bladder: Secondary | ICD-10-CM | POA: Diagnosis not present

## 2011-10-16 DIAGNOSIS — R319 Hematuria, unspecified: Secondary | ICD-10-CM | POA: Diagnosis not present

## 2011-11-20 DIAGNOSIS — H43399 Other vitreous opacities, unspecified eye: Secondary | ICD-10-CM | POA: Diagnosis not present

## 2011-11-20 DIAGNOSIS — H524 Presbyopia: Secondary | ICD-10-CM | POA: Diagnosis not present

## 2012-01-02 DIAGNOSIS — G5760 Lesion of plantar nerve, unspecified lower limb: Secondary | ICD-10-CM | POA: Diagnosis not present

## 2012-01-08 ENCOUNTER — Telehealth: Payer: Self-pay | Admitting: Cardiology

## 2012-01-08 ENCOUNTER — Ambulatory Visit (INDEPENDENT_AMBULATORY_CARE_PROVIDER_SITE_OTHER): Payer: Medicare Other | Admitting: Cardiology

## 2012-01-08 ENCOUNTER — Ambulatory Visit (INDEPENDENT_AMBULATORY_CARE_PROVIDER_SITE_OTHER)
Admission: RE | Admit: 2012-01-08 | Discharge: 2012-01-08 | Disposition: A | Discharge status: Home / Self Care | Payer: Medicare Other | Source: Ambulatory Visit | Attending: Cardiology | Admitting: Cardiology

## 2012-01-08 ENCOUNTER — Other Ambulatory Visit (INDEPENDENT_AMBULATORY_CARE_PROVIDER_SITE_OTHER): Payer: Medicare Other

## 2012-01-08 ENCOUNTER — Encounter: Payer: Self-pay | Admitting: Cardiology

## 2012-01-08 VITALS — BP 123/76 | HR 76 | Resp 18 | Ht 64.0 in | Wt 135.1 lb

## 2012-01-08 DIAGNOSIS — R079 Chest pain, unspecified: Secondary | ICD-10-CM

## 2012-01-08 DIAGNOSIS — E7800 Pure hypercholesterolemia, unspecified: Secondary | ICD-10-CM

## 2012-01-08 DIAGNOSIS — I119 Hypertensive heart disease without heart failure: Secondary | ICD-10-CM | POA: Diagnosis not present

## 2012-01-08 DIAGNOSIS — J8410 Pulmonary fibrosis, unspecified: Secondary | ICD-10-CM | POA: Diagnosis not present

## 2012-01-08 LAB — BASIC METABOLIC PANEL
BUN: 15 mg/dL (ref 6–23)
Calcium: 9.5 mg/dL (ref 8.4–10.5)
Creatinine, Ser: 0.6 mg/dL (ref 0.4–1.2)
GFR: 103.43 mL/min (ref >60.00)
Potassium: 4.1 mEq/L (ref 3.5–5.1)

## 2012-01-08 LAB — HEPATIC FUNCTION PANEL
ALT: 18 U/L (ref 0–35)
AST: 25 U/L (ref 0–37)
Alkaline Phosphatase: 78 U/L (ref 39–117)
Bilirubin, Direct: 0.1 mg/dL (ref 0.0–0.3)
Total Protein: 8 g/dL (ref 6.0–8.3)

## 2012-01-08 LAB — LIPID PANEL
Cholesterol: 182 mg/dL (ref 0–200)
VLDL: 13.8 mg/dL (ref 0.0–40.0)

## 2012-01-08 MED ORDER — SIMVASTATIN 40 MG PO TABS: 40.0000 mg | ORAL_TABLET | Freq: Every day | ORAL | Status: DC

## 2012-01-08 NOTE — Assessment & Plan Note (Signed)
The patient has a history of hypercholesterolemia and is on simvastatin 40 mg a day without side effects.  Blood work today is pending.

## 2012-01-08 NOTE — Telephone Encounter (Signed)
Message copied by Burnell Blanks on Thu Jan 08, 2012  6:21 PM ------      Message from: Cassell Clement      Created: Thu Jan 08, 2012 12:40 PM       Please report.  The chest x-ray showed a normal heart size.  The radiologist did not see any displaced rib fractures.  She could still have a small nondisplaced rib fracture.      Treatment in either case  is just "watchful waiting" letting it clear up on its own.  She can use ibuprofen or Tylenol as necessary

## 2012-01-08 NOTE — Telephone Encounter (Signed)
New problem:     Xray done today . Looking for test results

## 2012-01-08 NOTE — Assessment & Plan Note (Signed)
Patient has osteoporosis and is concerned she may have cracked a rib.  We will get a chest x-ray as well as left rib films.

## 2012-01-08 NOTE — Progress Notes (Signed)
Quick Note:  Please report to patient. The recent labs are stable. Continue same medication and careful diet.LDL slightly higher 100. Watch cholesterol in diet. ______

## 2012-01-08 NOTE — Telephone Encounter (Signed)
Advised patient

## 2012-01-08 NOTE — Assessment & Plan Note (Signed)
No chest pain or shortness of breath.  She was having some pleuritic pain from the trauma to the left chest wall

## 2012-01-08 NOTE — Patient Instructions (Signed)
Your physician recommends that you continue on your current medications as directed. Please refer to the Current Medication list given to you today.  Your physician wants you to follow-up in: 6 months with fasting labs (lp/bmet/hfp)  You will receive a reminder letter in the mail two months in advance. If you don't receive a letter, please call our office to schedule the follow-up appointment.   Will have you go for chest xray and left rib films to [facility] from [facility]

## 2012-01-08 NOTE — Progress Notes (Signed)
Misty Morales Date of Birth:  [birth year] [facility] [address] [place], [place]  [address] [phone]  Fax   [phone]  HPI: This pleasant 66 year old woman is seen for a six-month followup office visit. He has a past history of mild essential hypertension controlled with diet and exercise and a past history of hypercholesterolemia. He has been in good general health. She has not been experiencing any new symptoms recently regarding her heart.  Since we last saw her last week she fell out of bed and hit the night stand.  She hit her left ribs and is having pleuritic pain.  She wonders if she may have cracked a rib.  She has canceled her tennis games for the next 2 weeks.  Current Outpatient Prescriptions  Medication Sig Dispense Refill  . aspirin 81 MG tablet Take 81 mg by mouth daily.        . calcium-vitamin D (OSCAL WITH D) 500-200 MG-UNIT per tablet Take 1 tablet by mouth daily. Taking 750 bid      . Cholecalciferol (VITAMIN D PO) Take 1,000 Int'l Units by mouth daily.        . Glucosamine-Chondroitin (GLUCOSAMINE CHONDR COMPLEX PO) Take by mouth daily.        Marland Kitchen GRAPE SEED EXTRACT PO Take by mouth daily.        . Multiple Vitamin (MULTIVITAMIN) tablet Take 1 tablet by mouth daily.        . Omega-3 Fatty Acids (FISH OIL) 1000 MG CAPS Take by mouth daily.        . simvastatin (ZOCOR) 40 MG tablet Take 1 tablet (40 mg total) by mouth at bedtime.  90 tablet  PRN  . [DISCONTINUED] simvastatin (ZOCOR) 40 MG tablet TAKE 1 TABLET AT BEDTIME  90 tablet  PRN    No Known Allergies  Patient Active Problem List  Diagnosis  . Hypercholesterolemia  . Osteoporosis  . Benign hypertensive heart disease without heart failure    History  Smoking status  . Never Smoker   Smokeless tobacco  . Not on file    History  Alcohol Use  . Yes    Family History  Problem Relation Age of Onset  . Heart disease Mother   . Diabetes Mother   . Heart disease Father       Review of Systems: The patient denies any heat or cold intolerance.  No weight gain or weight loss.  The patient denies headaches or blurry vision.  There is no cough or sputum production.  The patient denies dizziness.  There is no hematuria or hematochezia.  The patient denies any muscle aches or arthritis.  The patient denies any rash.  The patient denies frequent falling or instability.  There is no history of depression or anxiety.  All other systems were reviewed and are negative.   Physical Exam: Filed Vitals:   01/08/12 1001  BP: 123/76  Pulse: 76  Resp: 18   the general appearance reveals a well-developed well-nourished woman in no distress.  She is mildly tender along the left anterolateral ribs below her left breast.The head and neck exam reveals pupils equal and reactive.  Extraocular movements are full.  There is no scleral icterus.  The mouth and pharynx are normal.  The neck is supple.  The carotids reveal no bruits.  The jugular venous pressure is normal.  The  thyroid is not enlarged.  There is no lymphadenopathy.  The chest is clear to percussion and  auscultation.  There are no rales or rhonchi.  Expansion of the chest is symmetrical.  The precordium is quiet.  The first heart sound is normal.  The second heart sound is physiologically split.  There is no murmur gallop rub or click.  There is no abnormal lift or heave.  The abdomen is soft and nontender.  The bowel sounds are normal.  The liver and spleen are not enlarged.  There are no abdominal masses.  There are no abdominal bruits.  Extremities reveal good pedal pulses.  There is no phlebitis or edema.  There is no cyanosis or clubbing.  Strength is normal and symmetrical in all extremities.  There is no lateralizing weakness.  There are no sensory deficits.  The skin is warm and dry.  There is no rash.      Assessment / Plan: Continue same medication.  Check chest x-ray.  Recheck in 6 months for followup office visit and  lab work

## 2012-01-09 ENCOUNTER — Telehealth: Payer: Self-pay | Admitting: *Deleted

## 2012-01-09 NOTE — Telephone Encounter (Signed)
Advised patient of lab results  

## 2012-01-09 NOTE — Telephone Encounter (Signed)
Message copied by Burnell Blanks on Fri Jan 09, 2012  1:18 PM ------      Message from: Cassell Clement      Created: Thu Jan 08, 2012  8:53 PM       Please report to patient.  The recent labs are stable. Continue same medication and careful diet.LDL slightly higher 100.  Watch cholesterol in diet.

## 2012-01-12 ENCOUNTER — Telehealth: Payer: Self-pay | Admitting: Cardiology

## 2012-01-12 NOTE — Telephone Encounter (Signed)
Discussed xray and labs with patient

## 2012-01-12 NOTE — Telephone Encounter (Signed)
rtn your call from Friday

## 2012-04-03 ENCOUNTER — Other Ambulatory Visit: Payer: Self-pay

## 2012-05-10 DIAGNOSIS — R319 Hematuria, unspecified: Secondary | ICD-10-CM | POA: Diagnosis not present

## 2012-06-09 DIAGNOSIS — Z124 Encounter for screening for malignant neoplasm of cervix: Secondary | ICD-10-CM | POA: Diagnosis not present

## 2012-06-09 DIAGNOSIS — Z1212 Encounter for screening for malignant neoplasm of rectum: Secondary | ICD-10-CM | POA: Diagnosis not present

## 2012-06-09 DIAGNOSIS — Z01419 Encounter for gynecological examination (general) (routine) without abnormal findings: Secondary | ICD-10-CM | POA: Diagnosis not present

## 2012-06-25 ENCOUNTER — Other Ambulatory Visit: Payer: Medicare Other

## 2012-06-25 ENCOUNTER — Ambulatory Visit: Payer: Medicare Other | Admitting: Cardiology

## 2012-07-01 ENCOUNTER — Encounter: Payer: Self-pay | Admitting: Cardiology

## 2012-07-01 ENCOUNTER — Ambulatory Visit (INDEPENDENT_AMBULATORY_CARE_PROVIDER_SITE_OTHER): Payer: Medicare Other | Admitting: Cardiology

## 2012-07-01 ENCOUNTER — Other Ambulatory Visit (INDEPENDENT_AMBULATORY_CARE_PROVIDER_SITE_OTHER): Payer: Medicare Other

## 2012-07-01 VITALS — BP 130/78 | HR 81 | Ht 64.0 in | Wt 137.8 lb

## 2012-07-01 DIAGNOSIS — I119 Hypertensive heart disease without heart failure: Secondary | ICD-10-CM | POA: Diagnosis not present

## 2012-07-01 DIAGNOSIS — E7800 Pure hypercholesterolemia, unspecified: Secondary | ICD-10-CM

## 2012-07-01 LAB — BASIC METABOLIC PANEL
BUN: 17 mg/dL (ref 6–23)
Creatinine, Ser: 0.6 mg/dL (ref 0.4–1.2)
GFR: 97.71 mL/min (ref >60.00)

## 2012-07-01 LAB — LIPID PANEL
Cholesterol: 178 mg/dL (ref 0–200)
LDL Cholesterol: 94 mg/dL (ref 0–99)
Total CHOL/HDL Ratio: 3
Triglycerides: 69 mg/dL (ref 0.0–149.0)
VLDL: 13.8 mg/dL (ref 0.0–40.0)

## 2012-07-01 LAB — HEPATIC FUNCTION PANEL: Total Bilirubin: 0.5 mg/dL (ref 0.3–1.2)

## 2012-07-01 NOTE — Assessment & Plan Note (Signed)
Blood pressure was remaining stable on careful diet and low-salt regimen

## 2012-07-01 NOTE — Progress Notes (Signed)
Quick Note:  Please report to patient. The recent labs are stable. Continue same medication and careful diet. ______ 

## 2012-07-01 NOTE — Patient Instructions (Addendum)
Will obtain labs today and call you with the results (lp/bmet/hfp)  Your physician recommends that you continue on your current medications as directed. Please refer to the Current Medication list given to you today.  Your physician wants you to follow-up in: 6 months with fasting labs (lp/bmet/hfp)  You will receive a reminder letter in the mail two months in advance. If you don't receive a letter, please call our office to schedule the follow-up appointment.  

## 2012-07-01 NOTE — Assessment & Plan Note (Signed)
Patient is on simvastatin.  She's not having any myalgias or side effects.  Blood work today pending

## 2012-07-01 NOTE — Progress Notes (Signed)
Clelia Schaumann Date of Birth:  06/22/1945 [facility] [address] [place], [place]  [address] [phone]  Fax   [phone]  HPI: This pleasant 67 year old woman is seen for a scheduled 6 month followup office visit.  She has a past history of mild essential hypertension controlled with diet and exercise and a past history of hypercholesterolemia.  She is physically active.  She plays for a high level of tennis 3 times a week.  She also walks 2 miles on the days that she does not play tennis.  She has not been experiencing any new cardiovascular symptoms.  Since we last saw her she got a tick bite on her thigh and developed a target-like rash and saw her dermatologist who treated her with doxycycline for 2 weeks with good results.   Current Outpatient Prescriptions  Medication Sig Dispense Refill  . aspirin 81 MG tablet Take 81 mg by mouth daily.        . calcium-vitamin D (OSCAL WITH D) 500-200 MG-UNIT per tablet Take 1 tablet by mouth daily. Taking 750 bid      . Cholecalciferol (VITAMIN D PO) Take 1,000 Int'l Units by mouth daily.        . Glucosamine-Chondroitin (GLUCOSAMINE CHONDR COMPLEX PO) Take by mouth daily.        Marland Kitchen GRAPE SEED EXTRACT PO Take by mouth daily.        . Multiple Vitamin (MULTIVITAMIN) tablet Take 1 tablet by mouth daily.        . Omega-3 Fatty Acids (FISH OIL) 1000 MG CAPS Take by mouth daily.        . simvastatin (ZOCOR) 40 MG tablet Take 1 tablet (40 mg total) by mouth at bedtime.  90 tablet  PRN  . vitamin E 100 UNIT capsule Take 100 Units by mouth daily. Unsure of units       No current facility-administered medications for this visit.    No Known Allergies  Patient Active Problem List   Diagnosis Date Noted  . Benign hypertensive heart disease without heart failure 01/06/2011  . Hypercholesterolemia   . Osteoporosis     History  Smoking status  . Never Smoker   Smokeless tobacco  . Not on file    History  Alcohol Use   . Yes    Family History  Problem Relation Age of Onset  . Heart disease Mother   . Diabetes Mother   . Heart disease Father     Review of Systems: The patient denies any heat or cold intolerance.  No weight gain or weight loss.  The patient denies headaches or blurry vision.  There is no cough or sputum production.  The patient denies dizziness.  There is no hematuria or hematochezia.  The patient denies any muscle aches or arthritis.  The patient denies any rash.  The patient denies frequent falling or instability.  There is no history of depression or anxiety.  All other systems were reviewed and are negative.   Physical Exam: Filed Vitals:   07/01/12 1057  BP: 130/78  Pulse: 81   the general appearance reveals a healthy-appearing woman in no distress.The head and neck exam reveals pupils equal and reactive.  Extraocular movements are full.  There is no scleral icterus.  The mouth and pharynx are normal.  The neck is supple.  The carotids reveal no bruits.  The jugular venous pressure is normal.  The  thyroid is not enlarged.  There is no  lymphadenopathy.  The chest is clear to percussion and auscultation.  There are no rales or rhonchi.  Expansion of the chest is symmetrical.  The precordium is quiet.  The first heart sound is normal.  The second heart sound is physiologically split.  There is no murmur gallop rub or click.  There is no abnormal lift or heave.  The abdomen is soft and nontender.  The bowel sounds are normal.  The liver and spleen are not enlarged.  There are no abdominal masses.  There are no abdominal bruits.  Extremities reveal good pedal pulses.  There is no phlebitis or edema.  There is no cyanosis or clubbing.  Strength is normal and symmetrical in all extremities.  There is no lateralizing weakness.  There are no sensory deficits.  The skin is warm and dry.  There is no rash.      Assessment / Plan: Continue on same medication.  They will be going to [place] this  summer for a holiday. Return in 6 months for followup office visit lipid panel hepatic function panel and basal metabolic panel.

## 2012-07-02 ENCOUNTER — Telehealth: Payer: Self-pay | Admitting: *Deleted

## 2012-07-02 NOTE — Telephone Encounter (Signed)
Advised patient of lab results  

## 2012-07-02 NOTE — Telephone Encounter (Signed)
Message copied by Burnell Blanks on Fri Jul 02, 2012  5:52 PM ------      Message from: Cassell Clement      Created: Thu Jul 01, 2012  8:37 PM       Please report to patient.  The recent labs are stable. Continue same medication and careful diet. ------

## 2012-08-30 ENCOUNTER — Other Ambulatory Visit: Payer: Self-pay | Admitting: *Deleted

## 2012-08-30 DIAGNOSIS — E7800 Pure hypercholesterolemia, unspecified: Secondary | ICD-10-CM

## 2012-08-30 MED ORDER — SIMVASTATIN 40 MG PO TABS: 40.0000 mg | ORAL_TABLET | Freq: Every day | ORAL | Status: DC

## 2012-09-22 ENCOUNTER — Other Ambulatory Visit: Payer: Self-pay

## 2012-11-25 DIAGNOSIS — H43399 Other vitreous opacities, unspecified eye: Secondary | ICD-10-CM | POA: Diagnosis not present

## 2012-11-25 DIAGNOSIS — H2510 Age-related nuclear cataract, unspecified eye: Secondary | ICD-10-CM | POA: Diagnosis not present

## 2012-11-25 DIAGNOSIS — H5210 Myopia, unspecified eye: Secondary | ICD-10-CM | POA: Diagnosis not present

## 2012-12-20 ENCOUNTER — Ambulatory Visit
Admission: RE | Admit: 2012-12-20 | Discharge: 2012-12-20 | Disposition: A | Discharge status: Home / Self Care | Payer: Medicare Other | Source: Ambulatory Visit | Attending: *Deleted | Admitting: *Deleted

## 2012-12-20 ENCOUNTER — Other Ambulatory Visit: Payer: Self-pay | Admitting: *Deleted

## 2012-12-20 DIAGNOSIS — R319 Hematuria, unspecified: Secondary | ICD-10-CM

## 2012-12-20 DIAGNOSIS — R310 Gross hematuria: Secondary | ICD-10-CM | POA: Diagnosis not present

## 2012-12-20 MED ORDER — IOHEXOL 300 MG/ML  SOLN
125.0000 mL | Freq: Once | INTRAMUSCULAR | Status: AC | PRN
  Administered 2012-12-20: 125 mL via INTRAVENOUS

## 2012-12-21 DIAGNOSIS — R319 Hematuria, unspecified: Secondary | ICD-10-CM | POA: Diagnosis not present

## 2012-12-23 ENCOUNTER — Other Ambulatory Visit: Payer: Self-pay

## 2012-12-31 ENCOUNTER — Ambulatory Visit (INDEPENDENT_AMBULATORY_CARE_PROVIDER_SITE_OTHER): Payer: Medicare Other | Admitting: Cardiology

## 2012-12-31 ENCOUNTER — Encounter: Payer: Self-pay | Admitting: Cardiology

## 2012-12-31 VITALS — BP 136/78 | HR 71 | Ht 69.0 in | Wt 137.0 lb

## 2012-12-31 DIAGNOSIS — I119 Hypertensive heart disease without heart failure: Secondary | ICD-10-CM | POA: Diagnosis not present

## 2012-12-31 DIAGNOSIS — N390 Urinary tract infection, site not specified: Secondary | ICD-10-CM | POA: Diagnosis not present

## 2012-12-31 DIAGNOSIS — E7800 Pure hypercholesterolemia, unspecified: Secondary | ICD-10-CM

## 2012-12-31 LAB — BASIC METABOLIC PANEL
BUN: 10 mg/dL (ref 6–23)
Calcium: 9.1 mg/dL (ref 8.4–10.5)
Creatinine, Ser: 0.5 mg/dL (ref 0.4–1.2)
GFR: 121.29 mL/min (ref >60.00)
Glucose, Bld: 92 mg/dL (ref 70–99)
Potassium: 3.7 mEq/L (ref 3.5–5.1)
Sodium: 139 mEq/L (ref 135–145)

## 2012-12-31 LAB — HEPATIC FUNCTION PANEL
ALT: 17 U/L (ref 0–35)
AST: 23 U/L (ref 0–37)
Alkaline Phosphatase: 73 U/L (ref 39–117)
Total Bilirubin: 0.5 mg/dL (ref 0.3–1.2)
Total Protein: 7.1 g/dL (ref 6.0–8.3)

## 2012-12-31 NOTE — Assessment & Plan Note (Signed)
She is no longer having any symptoms of UTI.  She has finished up her course of Cipro.  She does not have a history of recurrent UTIs.

## 2012-12-31 NOTE — Assessment & Plan Note (Signed)
Blood pressure was remaining stable on current therapy.  No headaches dizziness or syncope. 

## 2012-12-31 NOTE — Patient Instructions (Signed)
Your physician recommends that you have lab work today: BMP, LIPID and LIVER  Your physician recommends that you continue on your current medications as directed. Please refer to the Current Medication list given to you today.  Your physician wants you to follow-up in: 6 MONTHS with Dr Patty Sermons (Lipid, Liver and BMP) You will receive a reminder letter in the mail two months in advance. If you don't receive a letter, please call our office to schedule the follow-up appointment.

## 2012-12-31 NOTE — Progress Notes (Signed)
Misty Morales Date of Birth:  1945-09-07 [address] [place], [place]  [address] [phone]  Fax   [phone]  HPI: This pleasant 67 year old woman is seen for a scheduled 6 month followup office visit.  She has a past history of mild essential hypertension controlled with diet and exercise and a past history of hypercholesterolemia.  She is physically active.  She plays  a high level of tennis 3 times a week.  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  She also walks 2 miles on the days that she does not play tennis.  She has not been experiencing any new cardiovascular symptoms.  Since last visit she has developed a urinary tract infection and just finished a course of Cipro.  Dr. Darvin Neighbours is her urologist.  He had her take a CT scan which she states was normal.   Current Outpatient Prescriptions  Medication Sig Dispense Refill  . aspirin 81 MG tablet Take 81 mg by mouth daily.        . calcium-vitamin D (OSCAL WITH D) 500-200 MG-UNIT per tablet Take 1 tablet by mouth daily. Taking 750 bid      . Cholecalciferol (VITAMIN D PO) Take 1,000 Int'l Units by mouth daily.        . Glucosamine-Chondroitin (GLUCOSAMINE CHONDR COMPLEX PO) Take by mouth daily.        Marland Kitchen GRAPE SEED EXTRACT PO Take by mouth daily.        . Multiple Vitamin (MULTIVITAMIN) tablet Take 1 tablet by mouth daily.        . Omega-3 Fatty Acids (FISH OIL) 1000 MG CAPS Take by mouth daily.        . simvastatin (ZOCOR) 40 MG tablet Take 1 tablet (40 mg total) by mouth at bedtime.  90 tablet  PRN  . vitamin E 100 UNIT capsule Take 100 Units by mouth daily. Unsure of units       No current facility-administered medications for this visit.    No Known Allergies  Patient Active Problem List   Diagnosis Date Noted  . Benign hypertensive heart disease without heart failure 01/06/2011  . Hypercholesterolemia   . Osteoporosis     History  Smoking status  . Never Smoker   Smokeless tobacco   . Not on file    History  Alcohol Use  . Yes    Family History  Problem Relation Age of Onset  . Heart disease Mother   . Diabetes Mother   . Heart disease Father     Review of Systems: The patient denies any heat or cold intolerance.  No weight gain or weight loss.  The patient denies headaches or blurry vision.  There is no cough or sputum production.  The patient denies dizziness.  There is no hematuria or hematochezia.  The patient denies any muscle aches or arthritis.  The patient denies any rash.  The patient denies frequent falling or instability.  There is no history of depression or anxiety.  All other systems were reviewed and are negative.   Physical Exam: Filed Vitals:   12/31/12 0915  BP: 136/78  Pulse: 71   the general appearance reveals a healthy-appearing woman in no distress.The head and neck exam reveals pupils equal and reactive.  Extraocular movements are full.  There is no scleral icterus.  The mouth and pharynx are normal.  The neck is supple.  The carotids reveal no bruits.  The jugular venous pressure  is normal.  The  thyroid is not enlarged.  There is no lymphadenopathy.  The chest is clear to percussion and auscultation.  There are no rales or rhonchi.  Expansion of the chest is symmetrical.  The precordium is quiet.  The first heart sound is normal.  The second heart sound is physiologically split.  There is no murmur gallop rub or click.  There is no abnormal lift or heave.  The abdomen is soft and nontender.  The bowel sounds are normal.  The liver and spleen are not enlarged.  There are no abdominal masses.  There are no abdominal bruits.  Extremities reveal good pedal pulses.  There is no phlebitis or edema.  There is no cyanosis or clubbing.  Strength is normal and symmetrical in all extremities.  There is no lateralizing weakness.  There are no sensory deficits.  The skin is warm and dry.  There is no rash.   EKG today shows normal sinus rhythm and is  within normal limits   Assessment / Plan: Continue on same medication.  Blood work today is pending. Return in 6 months for followup office visit lipid panel hepatic function panel and basal metabolic panel.

## 2012-12-31 NOTE — Assessment & Plan Note (Signed)
We're checking her labs today.  She is not having any side effects from the statin therapy

## 2013-01-01 NOTE — Progress Notes (Signed)
Quick Note:  Please report to patient. The recent labs are stable. Continue same medication and careful diet. ______ 

## 2013-05-16 DIAGNOSIS — R319 Hematuria, unspecified: Secondary | ICD-10-CM | POA: Diagnosis not present

## 2013-06-13 DIAGNOSIS — Z01419 Encounter for gynecological examination (general) (routine) without abnormal findings: Secondary | ICD-10-CM | POA: Diagnosis not present

## 2013-06-13 DIAGNOSIS — M810 Age-related osteoporosis without current pathological fracture: Secondary | ICD-10-CM | POA: Diagnosis not present

## 2013-06-13 DIAGNOSIS — Z1212 Encounter for screening for malignant neoplasm of rectum: Secondary | ICD-10-CM | POA: Diagnosis not present

## 2013-06-13 DIAGNOSIS — Z1231 Encounter for screening mammogram for malignant neoplasm of breast: Secondary | ICD-10-CM | POA: Diagnosis not present

## 2013-06-13 DIAGNOSIS — N959 Unspecified menopausal and perimenopausal disorder: Secondary | ICD-10-CM | POA: Diagnosis not present

## 2013-06-24 ENCOUNTER — Ambulatory Visit: Payer: Medicare Other | Admitting: Cardiology

## 2013-06-24 ENCOUNTER — Other Ambulatory Visit: Payer: Medicare Other

## 2013-07-20 ENCOUNTER — Other Ambulatory Visit (INDEPENDENT_AMBULATORY_CARE_PROVIDER_SITE_OTHER): Payer: Medicare Other

## 2013-07-20 DIAGNOSIS — I119 Hypertensive heart disease without heart failure: Secondary | ICD-10-CM | POA: Diagnosis not present

## 2013-07-20 DIAGNOSIS — E7800 Pure hypercholesterolemia, unspecified: Secondary | ICD-10-CM | POA: Diagnosis not present

## 2013-07-20 LAB — HEPATIC FUNCTION PANEL
ALBUMIN: 4.2 g/dL (ref 3.5–5.2)
ALT: 18 U/L (ref 0–35)
AST: 22 U/L (ref 0–37)
Alkaline Phosphatase: 71 U/L (ref 39–117)
Bilirubin, Direct: 0 mg/dL (ref 0.0–0.3)
TOTAL PROTEIN: 7.2 g/dL (ref 6.0–8.3)
Total Bilirubin: 0.6 mg/dL (ref 0.2–1.2)

## 2013-07-20 LAB — BASIC METABOLIC PANEL
BUN: 15 mg/dL (ref 6–23)
CO2: 30 mEq/L (ref 19–32)
Calcium: 9.3 mg/dL (ref 8.4–10.5)
Chloride: 103 mEq/L (ref 96–112)
Creatinine, Ser: 0.7 mg/dL (ref 0.4–1.2)
GFR: 86.42 mL/min (ref >60.00)
Glucose, Bld: 93 mg/dL (ref 70–99)
Potassium: 4.3 mEq/L (ref 3.5–5.1)
SODIUM: 140 meq/L (ref 135–145)

## 2013-07-20 LAB — LIPID PANEL
CHOLESTEROL: 154 mg/dL (ref 0–200)
HDL: 60.7 mg/dL (ref >39.00)
LDL CALC: 82 mg/dL (ref 0–99)
NonHDL: 93.3
TRIGLYCERIDES: 58 mg/dL (ref 0.0–149.0)
Total CHOL/HDL Ratio: 3
VLDL: 11.6 mg/dL (ref 0.0–40.0)

## 2013-07-20 NOTE — Progress Notes (Signed)
Quick Note:  Please make copy of labs for patient visit. ______ 

## 2013-07-22 ENCOUNTER — Ambulatory Visit (INDEPENDENT_AMBULATORY_CARE_PROVIDER_SITE_OTHER): Payer: Medicare Other | Admitting: Cardiology

## 2013-07-22 ENCOUNTER — Encounter: Payer: Self-pay | Admitting: Cardiology

## 2013-07-22 ENCOUNTER — Other Ambulatory Visit: Payer: Medicare Other

## 2013-07-22 VITALS — BP 122/76 | HR 85 | Ht 69.0 in | Wt 138.0 lb

## 2013-07-22 DIAGNOSIS — E7800 Pure hypercholesterolemia, unspecified: Secondary | ICD-10-CM

## 2013-07-22 DIAGNOSIS — I119 Hypertensive heart disease without heart failure: Secondary | ICD-10-CM | POA: Diagnosis not present

## 2013-07-22 NOTE — Patient Instructions (Signed)
Your physician recommends that you continue on your current medications as directed. Please refer to the Current Medication list given to you today.  Your physician wants you to follow-up in: 6 months with fasting labs (lp/bmet/hfp)  You will receive a reminder letter in the mail two months in advance. If you don't receive a letter, please call our office to schedule the follow-up appointment.  

## 2013-07-22 NOTE — Assessment & Plan Note (Signed)
Her lipids are reviewed.  She continues to do well on low-dose simvastatin. She's not having any myalgias .

## 2013-07-22 NOTE — Progress Notes (Signed)
Misty Morales Date of Birth:  [birth year] [facility] [address] [place], [place]  [address] [phone]  Fax   [phone]  HPI: This pleasant 68 year old woman is seen for a scheduled 6 month followup office visit. She has a past history of mild essential hypertension controlled with diet and exercise and a past history of hypercholesterolemia. She is physically active. She plays a high level of tennis 3 times a week. 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 She also walks 2 miles on the days that she does not play tennis. She has not been experiencing any new cardiovascular symptoms. .   Current Outpatient Prescriptions  Medication Sig Dispense Refill  . aspirin 81 MG tablet Take 81 mg by mouth daily.        . calcium-vitamin D (OSCAL WITH D) 500-200 MG-UNIT per tablet Take 1 tablet by mouth daily. Taking 750 bid      . Cholecalciferol (VITAMIN D PO) Take 1,000 Int'l Units by mouth daily.        . Glucosamine-Chondroitin (GLUCOSAMINE CHONDR COMPLEX PO) Take by mouth daily.        Marland Kitchen GRAPE SEED EXTRACT PO Take by mouth daily.        . Multiple Vitamin (MULTIVITAMIN) tablet Take 1 tablet by mouth daily.        . Omega-3 Fatty Acids (FISH OIL) 1000 MG CAPS Take by mouth daily.        . simvastatin (ZOCOR) 40 MG tablet Take 1 tablet (40 mg total) by mouth at bedtime.  90 tablet  PRN  . vitamin E 100 UNIT capsule Take 100 Units by mouth daily. Unsure of units       No current facility-administered medications for this visit.    No Known Allergies  Patient Active Problem List   Diagnosis Date Noted  . UTI (urinary tract infection) 12/31/2012  . Benign hypertensive heart disease without heart failure 01/06/2011  . Hypercholesterolemia   . Osteoporosis     History  Smoking status  . Never Smoker   Smokeless tobacco  . Not on file    History  Alcohol Use  . Yes    Family History  Problem Relation Age of Onset  . Heart disease  Mother   . Diabetes Mother   . Heart disease Father     Review of Systems: The patient denies any heat or cold intolerance.  No weight gain or weight loss.  The patient denies headaches or blurry vision.  There is no cough or sputum production.  The patient denies dizziness.  There is no hematuria or hematochezia.  The patient denies any muscle aches or arthritis.  The patient denies any rash.  The patient denies frequent falling or instability.  There is no history of depression or anxiety.  All other systems were reviewed and are negative.   Physical Exam: Filed Vitals:   07/22/13 1517  BP: 122/76  Pulse: 85   the general appearance reveals a well-developed well-nourished woman in no distress.The head and neck exam reveals pupils equal and reactive.  Extraocular movements are full.  There is no scleral icterus.  The mouth and pharynx are normal.  The neck is supple.  The carotids reveal no bruits.  The jugular venous pressure is normal.  The  thyroid is not enlarged.  There is no lymphadenopathy.  The chest is clear to percussion and auscultation.  There are no rales or rhonchi.  Expansion of  the chest is symmetrical.  The precordium is quiet.  The first heart sound is normal.  The second heart sound is physiologically split.  There is no murmur gallop rub or click.  There is no abnormal lift or heave.  The abdomen is soft and nontender.  The bowel sounds are normal.  The liver and spleen are not enlarged.  There are no abdominal masses.  There are no abdominal bruits.  Extremities reveal good pedal pulses.  There is no phlebitis or edema.  There is no cyanosis or clubbing.  Strength is normal and symmetrical in all extremities.  There is no lateralizing weakness.  There are no sensory deficits.  The skin is warm and dry.  There is no rash.      Assessment / Plan: 1. essential hypertension controlled with diet and exercise 2. hypercholesterolemia on simvastatin 3.  osteoporosis  Disposition: Continue same medication.  Continue regular exercise including competitive tennis.  Recheck 6 months for office visit lipid panel hepatic function panel and basal metabolic panel.

## 2013-07-22 NOTE — Assessment & Plan Note (Signed)
She has been diagnosed with osteoporosis of the lower spine by her gynecologist Dr. Carren Rang.

## 2013-07-22 NOTE — Assessment & Plan Note (Signed)
Blood pressure has been remaining normal.  No chest pain shortness of breath palpitations dizziness or syncope.

## 2013-08-01 DIAGNOSIS — M79609 Pain in unspecified limb: Secondary | ICD-10-CM | POA: Diagnosis not present

## 2013-08-01 DIAGNOSIS — E049 Nontoxic goiter, unspecified: Secondary | ICD-10-CM | POA: Diagnosis not present

## 2013-08-01 DIAGNOSIS — M810 Age-related osteoporosis without current pathological fracture: Secondary | ICD-10-CM | POA: Diagnosis not present

## 2013-08-09 ENCOUNTER — Other Ambulatory Visit: Payer: Self-pay | Admitting: Endocrinology

## 2013-08-09 DIAGNOSIS — E049 Nontoxic goiter, unspecified: Secondary | ICD-10-CM

## 2013-08-10 ENCOUNTER — Ambulatory Visit
Admission: RE | Admit: 2013-08-10 | Discharge: 2013-08-10 | Disposition: A | Discharge status: Home / Self Care | Payer: Medicare Other | Source: Ambulatory Visit | Attending: Endocrinology | Admitting: Endocrinology

## 2013-08-10 DIAGNOSIS — E042 Nontoxic multinodular goiter: Secondary | ICD-10-CM | POA: Diagnosis not present

## 2013-08-10 DIAGNOSIS — E049 Nontoxic goiter, unspecified: Secondary | ICD-10-CM

## 2013-08-23 ENCOUNTER — Other Ambulatory Visit: Payer: Self-pay | Admitting: Endocrinology

## 2013-08-23 DIAGNOSIS — E041 Nontoxic single thyroid nodule: Secondary | ICD-10-CM

## 2013-08-30 ENCOUNTER — Other Ambulatory Visit (HOSPITAL_COMMUNITY)
Admission: RE | Admit: 2013-08-30 | Discharge: 2013-08-30 | Disposition: A | Discharge status: Home / Self Care | Payer: Medicare Other | Source: Ambulatory Visit | Attending: Interventional Radiology | Admitting: Interventional Radiology

## 2013-08-30 ENCOUNTER — Ambulatory Visit
Admission: RE | Admit: 2013-08-30 | Discharge: 2013-08-30 | Disposition: A | Discharge status: Home / Self Care | Payer: Medicare Other | Source: Ambulatory Visit | Attending: Endocrinology | Admitting: Endocrinology

## 2013-08-30 DIAGNOSIS — E049 Nontoxic goiter, unspecified: Secondary | ICD-10-CM | POA: Diagnosis present

## 2013-08-30 DIAGNOSIS — E041 Nontoxic single thyroid nodule: Secondary | ICD-10-CM

## 2013-09-13 ENCOUNTER — Telehealth: Payer: Self-pay | Admitting: Cardiology

## 2013-09-13 NOTE — Telephone Encounter (Signed)
New message     Patient want to know if it will be ok for her to have a shot (forteo) daily for 8yrs for osteoperosis of the spine.  The side effect is that it could lower her blood pressure.  Is this ok?

## 2013-09-13 NOTE — Telephone Encounter (Signed)
Advised patient

## 2013-09-13 NOTE — Telephone Encounter (Signed)
Will forward to  Dr. Brackbill for review 

## 2013-09-13 NOTE — Telephone Encounter (Signed)
Yes, okay to take Forteo shots

## 2013-10-05 ENCOUNTER — Other Ambulatory Visit: Payer: Self-pay | Admitting: Cardiology

## 2013-11-02 DIAGNOSIS — H00029 Hordeolum internum unspecified eye, unspecified eyelid: Secondary | ICD-10-CM | POA: Diagnosis not present

## 2013-11-02 DIAGNOSIS — H524 Presbyopia: Secondary | ICD-10-CM | POA: Diagnosis not present

## 2013-11-02 DIAGNOSIS — H2510 Age-related nuclear cataract, unspecified eye: Secondary | ICD-10-CM | POA: Diagnosis not present

## 2014-01-20 ENCOUNTER — Other Ambulatory Visit: Payer: Self-pay

## 2014-01-20 MED ORDER — SIMVASTATIN 40 MG PO TABS: 40.0000 mg | ORAL_TABLET | Freq: Every day | ORAL | Status: DC

## 2014-02-05 ENCOUNTER — Encounter: Payer: Self-pay | Admitting: Cardiology

## 2014-02-06 ENCOUNTER — Encounter: Payer: Self-pay | Admitting: Cardiology

## 2014-02-06 ENCOUNTER — Telehealth: Payer: Self-pay | Admitting: Cardiology

## 2014-02-06 ENCOUNTER — Ambulatory Visit (INDEPENDENT_AMBULATORY_CARE_PROVIDER_SITE_OTHER): Payer: Medicare Other | Admitting: Cardiology

## 2014-02-06 VITALS — BP 120/70 | HR 81 | Ht 63.0 in | Wt 138.4 lb

## 2014-02-06 DIAGNOSIS — R000 Tachycardia, unspecified: Secondary | ICD-10-CM | POA: Insufficient documentation

## 2014-02-06 DIAGNOSIS — I119 Hypertensive heart disease without heart failure: Secondary | ICD-10-CM | POA: Diagnosis not present

## 2014-02-06 DIAGNOSIS — R55 Syncope and collapse: Secondary | ICD-10-CM | POA: Diagnosis not present

## 2014-02-06 LAB — HEPATIC FUNCTION PANEL
ALK PHOS: 76 U/L (ref 39–117)
ALT: 17 U/L (ref 0–35)
AST: 23 U/L (ref 0–37)
Albumin: 4.3 g/dL (ref 3.5–5.2)
Bilirubin, Direct: 0 mg/dL (ref 0.0–0.3)
Total Bilirubin: 0.4 mg/dL (ref 0.2–1.2)
Total Protein: 7.1 g/dL (ref 6.0–8.3)

## 2014-02-06 LAB — BASIC METABOLIC PANEL
BUN: 16 mg/dL (ref 6–23)
CO2: 31 mEq/L (ref 19–32)
Calcium: 9.4 mg/dL (ref 8.4–10.5)
Chloride: 102 mEq/L (ref 96–112)
Creatinine, Ser: 0.7 mg/dL (ref 0.4–1.2)
GFR: 89.17 mL/min (ref >60.00)
Glucose, Bld: 110 mg/dL — ABNORMAL HIGH (ref 70–99)
POTASSIUM: 3.9 meq/L (ref 3.5–5.1)
SODIUM: 140 meq/L (ref 135–145)

## 2014-02-06 LAB — CBC WITH DIFFERENTIAL/PLATELET
BASOS ABS: 0 10*3/uL (ref 0.0–0.1)
Basophils Relative: 0.4 % (ref 0.0–3.0)
EOS ABS: 0.1 10*3/uL (ref 0.0–0.7)
Eosinophils Relative: 2.1 % (ref 0.0–5.0)
HCT: 38.6 % (ref 36.0–46.0)
Hemoglobin: 12.6 g/dL (ref 12.0–15.0)
Lymphocytes Relative: 36.6 % (ref 12.0–46.0)
Lymphs Abs: 2.3 10*3/uL (ref 0.7–4.0)
MCHC: 32.6 g/dL (ref 30.0–36.0)
MCV: 93.6 fl (ref 78.0–100.0)
Monocytes Absolute: 0.7 10*3/uL (ref 0.1–1.0)
Monocytes Relative: 10.5 % (ref 3.0–12.0)
NEUTROS PCT: 50.4 % (ref 43.0–77.0)
Neutro Abs: 3.2 10*3/uL (ref 1.4–7.7)
Platelets: 274 10*3/uL (ref 150.0–400.0)
RBC: 4.12 Mil/uL (ref 3.87–5.11)
RDW: 14.3 % (ref 11.5–15.5)
WBC: 6.3 10*3/uL (ref 4.0–10.5)

## 2014-02-06 NOTE — Patient Instructions (Addendum)
Will obtain labs today and call you with the results (cbc/bmet/tsh/hfp)  Your physician has requested that you have an echocardiogram. Echocardiography is a painless test that uses sound waves to create images of your heart. It provides your doctor with information about the size and shape of your heart and how well your heart's chambers and valves are working. This procedure takes approximately one hour. There are no restrictions for this procedure.   Your physician has recommended that you wear an event monitor. Event monitors are medical devices that record the heart's electrical activity. Doctors most often [place] these monitors to diagnose arrhythmias. Arrhythmias are problems with the speed or rhythm of the heartbeat. The monitor is a small, portable device. You can wear one while you do your normal daily activities. This is usually used to diagnose what is causing palpitations/syncope (passing out). 30 day  Avoid caffeine  Your physician recommends that you schedule a follow-up appointment in: 1 month ov

## 2014-02-06 NOTE — Progress Notes (Signed)
Misty Morales Date of Birth:  November 20, 1945 [facility] [address] [place], [place]  [address] [phone]  Fax   [phone]  HPI: This pleasant 68 year old woman is seen for a work in  office visit.  She was in good health.  2 nights ago she was at a neighborhood party.  She had one glass of wine.  She then had a supper and after supper she had 2 cups of coffee.  While she was standing talking to a neighbor she suddenly felt her heart rate race very fast for about 10 or 15 seconds.  She felt like she was going to pass out.  She reached for the chair but did not reach it and fell to the floor.  She had some minor bruising on the left side of her body.  She awoke promptly and was helped to a chair.  She set therefore about 15 minutes and felt OK.  She has never had syncope before she has not been aware of any racing of her heart previously.  She gets regular exercise playing in tennis tournaments and has good cardiovascular fitness.  She has a past history of mild essential hypertension controlled with diet and exercise and a past history of hypercholesterolemia. She is physically active. She plays a high level of tennis 3 times a week. 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 She also walks 2 miles on the days that she does not play tennis.   Current Outpatient Prescriptions  Medication Sig Dispense Refill  . aspirin 81 MG tablet Take 81 mg by mouth daily.      . calcium-vitamin D (OSCAL WITH D) 500-200 MG-UNIT per tablet Take 1 tablet by mouth daily. Taking 750 bid    . Cholecalciferol (VITAMIN D PO) Take 1,000 Int'l Units by mouth daily.      . Glucosamine-Chondroitin (GLUCOSAMINE CHONDR COMPLEX PO) Take by mouth daily.      Marland Kitchen GRAPE SEED EXTRACT PO Take by mouth daily.      . Multiple Vitamin (MULTIVITAMIN) tablet Take 1 tablet by mouth daily.      . Omega-3 Fatty Acids (FISH OIL) 1000 MG CAPS Take by mouth daily.      . simvastatin (ZOCOR) 40 MG  tablet Take 1 tablet (40 mg total) by mouth at bedtime. 90 tablet 0  . Teriparatide, Recombinant, (FORTEO) 600 MCG/2.4ML SOLN Inject 20 mcg into the skin at bedtime.    . vitamin E 100 UNIT capsule Take 100 Units by mouth daily. Unsure of units     No current facility-administered medications for this visit.    No Known Allergies  Patient Active Problem List   Diagnosis Date Noted  . Syncope 02/06/2014  . Very rapid heartbeat 02/06/2014  . UTI (urinary tract infection) 12/31/2012  . Benign hypertensive heart disease without heart failure 01/06/2011  . Hypercholesterolemia   . Osteoporosis     History  Smoking status  . Never Smoker   Smokeless tobacco  . Not on file    History  Alcohol Use  . Yes    Family History  Problem Relation Age of Onset  . Heart disease Mother   . Diabetes Mother   . Heart disease Father     Review of Systems: The patient denies any heat or cold intolerance.  No weight gain or weight loss.  The patient denies headaches or blurry vision.  There is no cough or sputum production.  The patient  denies dizziness.  There is no hematuria or hematochezia.  The patient denies any muscle aches or arthritis.  The patient denies any rash.  The patient denies frequent falling or instability.  There is no history of depression or anxiety.  All other systems were reviewed and are negative.   Physical Exam: Filed Vitals:   02/06/14 1346  BP: 120/70  Pulse: 81   the general appearance reveals a well-developed well-nourished woman in no distress.The head and neck exam reveals pupils equal and reactive.  Extraocular movements are full.  There is no scleral icterus.  The mouth and pharynx are normal.  The neck is supple.  The carotids reveal no bruits.  The jugular venous pressure is normal.  The  thyroid is not enlarged.  There is no lymphadenopathy.  The chest is clear to percussion and auscultation.  There are no rales or rhonchi.  Expansion of the chest is  symmetrical.  The precordium is quiet.  The first heart sound is normal.  The second heart sound is physiologically split.  There is no murmur gallop rub or click.  There is no abnormal lift or heave.  The abdomen is soft and nontender.  The bowel sounds are normal.  The liver and spleen are not enlarged.  There are no abdominal masses.  There are no abdominal bruits.  Extremities reveal good pedal pulses.  There is no phlebitis or edema.  There is no cyanosis or clubbing.  Strength is normal and symmetrical in all extremities.  There is no lateralizing weakness.  There are no sensory deficits.  The skin is warm and dry.  There is no rash.  EKG shows normal sinus rhythm and is within normal limits and is unchanged since 12/31/12    Assessment / Plan: 1. essential hypertension controlled with diet and exercise 2. hypercholesterolemia on simvastatin 3. Osteoporosis 4.  Tachycardia and syncope.  She may have had SVT with a post tachycardia pause.  Disposition: She was advised to avoid caffeine. We are checking lab work today including CBC and basal metabolic panel TSH and hepatic function panel.  We will arrange for a 2-D echocardiogram and for a 30 day event monitor.  Recheck in one month.

## 2014-02-06 NOTE — Telephone Encounter (Signed)
New Msg     Pt states her heart started fluttering and then she passed out on Friday.   Pt states she fell over to her left side and thinks she may need to be seen.  May be contacted at [phone].

## 2014-02-06 NOTE — Telephone Encounter (Signed)
Advised patient of lab results  Scheduled follow up labs

## 2014-02-07 LAB — TSH: TSH: 1.26 u[IU]/mL (ref 0.35–4.50)

## 2014-02-08 ENCOUNTER — Ambulatory Visit (INDEPENDENT_AMBULATORY_CARE_PROVIDER_SITE_OTHER): Payer: Medicare Other | Admitting: *Deleted

## 2014-02-08 ENCOUNTER — Ambulatory Visit (HOSPITAL_COMMUNITY): Payer: Medicare Other | Attending: Cardiovascular Disease

## 2014-02-08 ENCOUNTER — Encounter: Payer: Self-pay | Admitting: *Deleted

## 2014-02-08 ENCOUNTER — Telehealth: Payer: Self-pay | Admitting: *Deleted

## 2014-02-08 DIAGNOSIS — I10 Essential (primary) hypertension: Secondary | ICD-10-CM | POA: Insufficient documentation

## 2014-02-08 DIAGNOSIS — R55 Syncope and collapse: Secondary | ICD-10-CM | POA: Diagnosis not present

## 2014-02-08 DIAGNOSIS — N390 Urinary tract infection, site not specified: Secondary | ICD-10-CM | POA: Diagnosis not present

## 2014-02-08 DIAGNOSIS — E785 Hyperlipidemia, unspecified: Secondary | ICD-10-CM | POA: Insufficient documentation

## 2014-02-08 DIAGNOSIS — M810 Age-related osteoporosis without current pathological fracture: Secondary | ICD-10-CM | POA: Insufficient documentation

## 2014-02-08 NOTE — Progress Notes (Signed)
2D Echo completed. 02/08/2014 

## 2014-02-08 NOTE — Progress Notes (Signed)
Patient ID: Misty Morales, female   DOB: 26-Sep-1945, 68 y.o.   MRN: [id number] [employer] 30 day cardiac event monitor applied to patient.

## 2014-02-08 NOTE — Telephone Encounter (Signed)
Pt notified about lab results with verbal understanding 

## 2014-02-08 NOTE — Progress Notes (Signed)
Patient ID: Misty Morales, female   DOB: 04/30/1945, 68 y.o.   MRN: [id number] [employer] 30 day cardiac event monitor applied to patient.

## 2014-02-09 DIAGNOSIS — R55 Syncope and collapse: Secondary | ICD-10-CM | POA: Diagnosis not present

## 2014-02-13 ENCOUNTER — Telehealth: Payer: Self-pay | Admitting: Cardiology

## 2014-02-13 NOTE — Telephone Encounter (Signed)
-----   Message from Darlin Coco, MD sent at 02/12/2014  3:24 PM EST ----- Echo shows no significant abnormalities.  Please report.

## 2014-02-13 NOTE — Telephone Encounter (Signed)
New Msg      Pt calling to get results of echo Please contact at [phone].

## 2014-02-13 NOTE — Telephone Encounter (Signed)
Advised patient

## 2014-03-15 ENCOUNTER — Telehealth: Payer: Self-pay | Admitting: *Deleted

## 2014-03-15 NOTE — Telephone Encounter (Signed)
Advised patient   3O day event monitor reviewed by  Dr. Mare Ferrari  Interpretation: no significant arrhythmia

## 2014-03-20 ENCOUNTER — Ambulatory Visit: Payer: Medicare Other | Admitting: Cardiology

## 2014-03-21 ENCOUNTER — Encounter: Payer: Self-pay | Admitting: Cardiology

## 2014-03-21 ENCOUNTER — Other Ambulatory Visit: Payer: Self-pay | Admitting: *Deleted

## 2014-03-21 ENCOUNTER — Ambulatory Visit (INDEPENDENT_AMBULATORY_CARE_PROVIDER_SITE_OTHER): Payer: Medicare Other | Admitting: Cardiology

## 2014-03-21 VITALS — BP 142/88 | HR 77 | Ht 63.0 in | Wt 138.0 lb

## 2014-03-21 DIAGNOSIS — E7800 Pure hypercholesterolemia, unspecified: Secondary | ICD-10-CM

## 2014-03-21 DIAGNOSIS — I119 Hypertensive heart disease without heart failure: Secondary | ICD-10-CM | POA: Diagnosis not present

## 2014-03-21 NOTE — Patient Instructions (Addendum)
Monitor blood pressure at home  Your physician recommends that you continue on your current medications as directed. Please refer to the Current Medication list given to you today.  Your physician wants you to follow-up in: 6 months with fasting labs (lp/bmet/hfp) You will receive a reminder letter in the mail two months in advance. If you don't receive a letter, please call our office to schedule the follow-up appointment.

## 2014-03-21 NOTE — Progress Notes (Signed)
Cardiology Office Note   Date:  03/21/2014   ID:  Misty, Morales 03-11-1945, MRN [id number]  PCP:  Gus Height, MD  Cardiologist:   Darlin Coco, MD   No chief complaint on file.     History of Present Illness: Misty Morales is a 69 y.o. female who presents for follow-up office visit. She has been in good general health.  She plays competitive tennis.  She is not experiencing any chest pain or shortness of breath.  In December at a neighborhood Christmas party she had an episode of syncope.  She had had one glass of wine prior to that.  There was no prior history of any syncopal problems.  She was unresponsive for only a matter of a few seconds and awoke promptly.  There was no seizure activity.  She did not seek medical attention at that time.  Subsequently we were to see her in the office.  He had a 30 day event monitor which showed no arrhythmia.  He had an echocardiogram which was normal.  He has had no further episodes of dizziness or syncope.   Past Medical History  Diagnosis Date  . Hypercholesterolemia   . Osteoporosis   . Hypertension   . Fracture 05/14/2007    Malunion, fracture, right ulna  . Mastitis     Right breast mastitis with septicemia, resolving.  Patient clinically stable on discharge  . Leukocytosis     probably secondary to #1, improved, needs outpatient follow-up.  . Diarrhea   . Clostridium difficile colitis   . Fever   . Chills   . Breast swelling     right breast swelling and pain    Past Surgical History  Procedure Laterality Date  . Rotator cuff repair  unknown  . Arm surg  arm surgery 2010    fractured right forearm      Current Outpatient Prescriptions  Medication Sig Dispense Refill  . aspirin 81 MG tablet Take 81 mg by mouth daily.      . calcium-vitamin D (OSCAL WITH D) 500-200 MG-UNIT per tablet Take 1 tablet by mouth daily. Taking 750 bid    . Cholecalciferol (VITAMIN D PO) Take 1,000 Int'l Units by mouth daily.      .  Glucosamine-Chondroitin (GLUCOSAMINE CHONDR COMPLEX PO) Take by mouth daily.      Marland Kitchen GRAPE SEED EXTRACT PO Take by mouth daily.      . Multiple Vitamin (MULTIVITAMIN) tablet Take 1 tablet by mouth daily.      . Omega-3 Fatty Acids (FISH OIL) 1000 MG CAPS Take by mouth daily.      . simvastatin (ZOCOR) 40 MG tablet Take 1 tablet (40 mg total) by mouth at bedtime. 90 tablet 0  . Teriparatide, Recombinant, (FORTEO) 600 MCG/2.4ML SOLN Inject 20 mcg into the skin at bedtime.    . vitamin E 100 UNIT capsule Take 100 Units by mouth daily. Unsure of units     No current facility-administered medications for this visit.    Allergies:   Adhesive    Social History:  The patient  reports that she has never smoked. She does not have any smokeless tobacco history on file. She reports that she drinks alcohol.   Family History:  The patient's family history includes Diabetes in her mother; Heart disease in her father and mother.    ROS:  Please see the history of present illness.   Otherwise, review of systems are positive for none.   All  other systems are reviewed and negative.    PHYSICAL EXAM: VS:  BP 142/88 mmHg  Pulse 77  Ht 5\' 3"  (1.6 m)  Wt 138 lb (62.596 kg)  BMI 24.45 kg/m2 , BMI Body mass index is 24.45 kg/(m^2). GEN: Well nourished, well developed, in no acute distress HEENT: normal Neck: no JVD, carotid bruits, or masses Cardiac: RRR; no murmurs, rubs, or gallops,no edema  Respiratory:  clear to auscultation bilaterally, normal work of breathing GI: soft, nontender, nondistended, + BS MS: no deformity or atrophy Skin: warm and dry, no rash Neuro:  Strength and sensation are intact Psych: euthymic mood, full affect   EKG:  EKG is not ordered today.    Recent Labs: 02/06/2014: ALT 17; BUN 16; Creatinine 0.7; Hemoglobin 12.6; Platelets 274.0; Potassium 3.9; Sodium 140; TSH 1.26    Lipid Panel    Component Value Date/Time   CHOL 154 07/20/2013 0935   TRIG 58.0 07/20/2013  0935   HDL 60.70 07/20/2013 0935   CHOLHDL 3 07/20/2013 0935   VLDL 11.6 07/20/2013 0935   LDLCALC 82 07/20/2013 0935      Wt Readings from Last 3 Encounters:  03/21/14 138 lb (62.596 kg)  02/06/14 138 lb 6.4 oz (62.778 kg)  07/22/13 138 lb (62.596 kg)      Other studies Reviewed:   ASSESSMENT AND PLAN:   1.  Labile hypertension today I repeated her blood pressure and it was 140/82.  She has had a recent cold and she did take some NyQuil cough suppressant last night. She will monitor her blood pressure at home and let us know if it remains elevated. 2.  Hypercholesterolemia 3.  Osteoporosis on Forteo through her PCP   Current medicines are reviewed at length with the patient today.  The patient does not have concerns regarding medicines.  The following changes have been made:  no change  Labs/ tests ordered today include: None  No orders of the defined types were placed in this encounter.     Disposition:   FU with Dr. Mare Ferrari in 6 months for office visit lipid panel hepatic function panel and basal metabolic panel.   Signed, Darlin Coco, MD  03/21/2014 1:20 PM    [facility] [place], [place], [place]  [address] Phone: [phone]; Fax: [phone]

## 2014-03-27 ENCOUNTER — Other Ambulatory Visit: Payer: Self-pay | Admitting: Cardiology

## 2014-03-28 ENCOUNTER — Other Ambulatory Visit: Payer: Self-pay | Admitting: Cardiology

## 2014-06-21 ENCOUNTER — Other Ambulatory Visit: Payer: Self-pay | Admitting: Gynecology

## 2014-06-21 DIAGNOSIS — Z1231 Encounter for screening mammogram for malignant neoplasm of breast: Secondary | ICD-10-CM | POA: Diagnosis not present

## 2014-06-21 DIAGNOSIS — Z1212 Encounter for screening for malignant neoplasm of rectum: Secondary | ICD-10-CM | POA: Diagnosis not present

## 2014-06-21 DIAGNOSIS — Z124 Encounter for screening for malignant neoplasm of cervix: Secondary | ICD-10-CM | POA: Diagnosis not present

## 2014-06-21 DIAGNOSIS — Z6823 Body mass index (BMI) 23.0-23.9, adult: Secondary | ICD-10-CM | POA: Diagnosis not present

## 2014-06-21 DIAGNOSIS — Z01419 Encounter for gynecological examination (general) (routine) without abnormal findings: Secondary | ICD-10-CM | POA: Diagnosis not present

## 2014-06-22 ENCOUNTER — Other Ambulatory Visit: Payer: Self-pay | Admitting: Gynecology

## 2014-06-22 DIAGNOSIS — R928 Other abnormal and inconclusive findings on diagnostic imaging of breast: Secondary | ICD-10-CM

## 2014-06-22 LAB — CYTOLOGY - PAP

## 2014-06-28 ENCOUNTER — Ambulatory Visit
Admission: RE | Admit: 2014-06-28 | Discharge: 2014-06-28 | Disposition: A | Discharge status: Home / Self Care | Payer: Medicare Other | Source: Ambulatory Visit | Attending: Gynecology | Admitting: Gynecology

## 2014-06-28 DIAGNOSIS — R928 Other abnormal and inconclusive findings on diagnostic imaging of breast: Secondary | ICD-10-CM

## 2014-06-28 DIAGNOSIS — N63 Unspecified lump in breast: Secondary | ICD-10-CM | POA: Diagnosis not present

## 2014-06-28 DIAGNOSIS — N6002 Solitary cyst of left breast: Secondary | ICD-10-CM | POA: Diagnosis not present

## 2014-07-18 DIAGNOSIS — T07 Unspecified multiple injuries: Secondary | ICD-10-CM | POA: Diagnosis not present

## 2014-08-03 DIAGNOSIS — E049 Nontoxic goiter, unspecified: Secondary | ICD-10-CM | POA: Diagnosis not present

## 2014-08-03 DIAGNOSIS — M810 Age-related osteoporosis without current pathological fracture: Secondary | ICD-10-CM | POA: Diagnosis not present

## 2014-10-12 ENCOUNTER — Ambulatory Visit: Payer: Medicare Other | Admitting: Cardiology

## 2014-11-09 DIAGNOSIS — H25013 Cortical age-related cataract, bilateral: Secondary | ICD-10-CM | POA: Diagnosis not present

## 2014-11-09 DIAGNOSIS — H524 Presbyopia: Secondary | ICD-10-CM | POA: Diagnosis not present

## 2014-11-09 DIAGNOSIS — H2513 Age-related nuclear cataract, bilateral: Secondary | ICD-10-CM | POA: Diagnosis not present

## 2014-11-29 ENCOUNTER — Encounter: Payer: Self-pay | Admitting: Cardiology

## 2014-11-29 ENCOUNTER — Ambulatory Visit (INDEPENDENT_AMBULATORY_CARE_PROVIDER_SITE_OTHER): Payer: Medicare Other | Admitting: Cardiology

## 2014-11-29 VITALS — BP 152/90 | HR 73 | Ht 63.0 in | Wt 136.1 lb

## 2014-11-29 DIAGNOSIS — I119 Hypertensive heart disease without heart failure: Secondary | ICD-10-CM | POA: Diagnosis not present

## 2014-11-29 DIAGNOSIS — E7800 Pure hypercholesterolemia, unspecified: Secondary | ICD-10-CM | POA: Diagnosis not present

## 2014-11-29 LAB — BASIC METABOLIC PANEL
BUN: 13 mg/dL (ref 7–25)
CHLORIDE: 105 mmol/L (ref 98–110)
CO2: 30 mmol/L (ref 20–31)
Calcium: 9.6 mg/dL (ref 8.6–10.4)
Creat: 0.63 mg/dL (ref 0.60–0.93)
Glucose, Bld: 93 mg/dL (ref 65–99)
POTASSIUM: 4.1 mmol/L (ref 3.5–5.3)
Sodium: 142 mmol/L (ref 135–146)

## 2014-11-29 LAB — LIPID PANEL
CHOL/HDL RATIO: 2.4 ratio (ref <=5.0)
Cholesterol: 157 mg/dL (ref 125–200)
HDL: 65 mg/dL (ref >=46)
LDL Cholesterol: 81 mg/dL (ref <130)
TRIGLYCERIDES: 55 mg/dL (ref <150)
VLDL: 11 mg/dL (ref <30)

## 2014-11-29 LAB — HEPATIC FUNCTION PANEL
ALK PHOS: 77 U/L (ref 33–130)
ALT: 20 U/L (ref 6–29)
AST: 28 U/L (ref 10–35)
Albumin: 4.5 g/dL (ref 3.6–5.1)
BILIRUBIN TOTAL: 0.4 mg/dL (ref 0.2–1.2)
TOTAL PROTEIN: 7.3 g/dL (ref 6.1–8.1)

## 2014-11-29 MED ORDER — LOSARTAN POTASSIUM 25 MG PO TABS: 25.0000 mg | ORAL_TABLET | Freq: Every day | ORAL | Status: DC

## 2014-11-29 NOTE — Progress Notes (Signed)
Cardiology Office Note   Date:  11/29/2014   ID:  JAMILLIA CLOSSON, DOB [birth year], MRN [id number]  PCP:  Harle Battiest, MD  Cardiologist: Darlin Coco MD  No chief complaint on file.     History of Present Illness: Misty Morales is a 69 y.o. female who presents for a six-month follow-up visit  RAZIAH Morales is a 69 y.o. female who presents for follow-up office visit. She has been in good general health. She plays competitive tennis. She is not experiencing any chest pain or shortness of breath. In December 2015 at a neighborhood Christmas party she had an episode of syncope. She had had one glass of wine prior to that. There was no prior history of any syncopal problems. She was unresponsive for only a matter of a few seconds and awoke promptly. There was no seizure activity. She did not seek medical attention at that time. Subsequently we were to see her in the office. He had a 30 day event monitor which showed no arrhythmia. He had an echocardiogram which was normal. He has had no further episodes of dizziness or syncope. She has not been checking her blood pressure at home.  She has been walking daily for exercise 2 miles a day.  Her weight is down 2 pounds since last visit.  She has not been having any chest pain or shortness of breath. Her blood pressure is elevated today.  He was also elevated at her last visit.  We will start her on low dose losartan 25 mg daily.  Past Medical History  Diagnosis Date  . Hypercholesterolemia   . Osteoporosis   . Hypertension   . Fracture 05/14/2007    Malunion, fracture, right ulna  . Mastitis     Right breast mastitis with septicemia, resolving.  Patient clinically stable on discharge  . Leukocytosis     probably secondary to #1, improved, needs outpatient follow-up.  . Diarrhea   . Clostridium difficile colitis   . Fever   . Chills   . Breast swelling     right breast swelling and pain    Past Surgical History    Procedure Laterality Date  . Rotator cuff repair  unknown  . Arm surg  arm surgery 2010    fractured right forearm      Current Outpatient Prescriptions  Medication Sig Dispense Refill  . aspirin 81 MG tablet Take 81 mg by mouth daily.      . calcium-vitamin D (OSCAL WITH D) 500-200 MG-UNIT per tablet Take 1 tablet by mouth daily. Taking 750 bid    . Cholecalciferol (VITAMIN D PO) Take 1,000 Int'l Units by mouth daily.      . Glucosamine-Chondroitin (GLUCOSAMINE CHONDR COMPLEX PO) Take by mouth daily.      Marland Kitchen GRAPE SEED EXTRACT PO Take by mouth daily.      . Multiple Vitamin (MULTIVITAMIN) tablet Take 1 tablet by mouth daily.      . Omega-3 Fatty Acids (FISH OIL) 1000 MG CAPS Take by mouth daily.      . simvastatin (ZOCOR) 40 MG tablet TAKE ONE TABLET BY MOUTH AT BEDTIME 90 tablet 3  . Teriparatide, Recombinant, (FORTEO) 600 MCG/2.4ML SOLN Inject 20 mcg into the skin at bedtime.    . vitamin E 100 UNIT capsule Take 100 Units by mouth daily. Unsure of units    . losartan (COZAAR) 25 MG tablet Take 1 tablet (25 mg total) by mouth daily. 90 tablet 3  No current facility-administered medications for this visit.    Allergies:   Adhesive    Social History:  The patient  reports that she has never smoked. She does not have any smokeless tobacco history on file. She reports that she drinks alcohol.   Family History:  The patient's family history includes Diabetes in her mother; Heart disease in her father and mother.    ROS:  Please see the history of present illness.   Otherwise, review of systems are positive for none.   All other systems are reviewed and negative.    PHYSICAL EXAM: VS:  BP 152/90 mmHg  Pulse 73  Ht 5\' 3"  (1.6 m)  Wt 136 lb 1.9 oz (61.744 kg)  BMI 24.12 kg/m2 , BMI Body mass index is 24.12 kg/(m^2). GEN: Well nourished, well developed, in no acute distress HEENT: normal Neck: no JVD, carotid bruits, or masses Cardiac: RRR; no murmurs, rubs, or gallops,no edema   Respiratory:  clear to auscultation bilaterally, normal work of breathing GI: soft, nontender, nondistended, + BS MS: no deformity or atrophy Skin: warm and dry, no rash Neuro:  Strength and sensation are intact Psych: euthymic mood, full affect   EKG:  EKG is not ordered today.    Recent Labs: 02/06/2014: ALT 17; BUN 16; Creatinine, Ser 0.7; Hemoglobin 12.6; Platelets 274.0; Potassium 3.9; Sodium 140; TSH 1.26    Lipid Panel    Component Value Date/Time   CHOL 154 07/20/2013 0935   TRIG 58.0 07/20/2013 0935   HDL 60.70 07/20/2013 0935   CHOLHDL 3 07/20/2013 0935   VLDL 11.6 07/20/2013 0935   LDLCALC 82 07/20/2013 0935      Wt Readings from Last 3 Encounters:  11/29/14 136 lb 1.9 oz (61.744 kg)  03/21/14 138 lb (62.596 kg)  02/06/14 138 lb 6.4 oz (62.778 kg)         ASSESSMENT AND PLAN:  1. Essential hypertension.  She has a family history of high blood pressure. We will start her on losartan 25 mg one daily.  We will have her return in 2 weeks for a follow-up basal metabolic panel to be sure her kidney function is okay.  We are checking baseline labs today 2. Hypercholesterolemia 3. Osteoporosis on Forteo through her PCP.  She will be on Forteo for a total of 2 years.   Current medicines are reviewed at length with the patient today.  The patient does not have concerns regarding medicines.  The following changes have been made:  Add losartan.  Labs/ tests ordered today include:   Orders Placed This Encounter  Procedures  . Lipid panel  . Hepatic function panel  . Basic metabolic panel  . Basic metabolic panel   Disposition: She will start monitoring her blood pressure at home.  We are starting her on losartan.  Continue regular exercise as she is doing.  We are checking baseline labs today.  Recheck in 6 months for office visit lipid panel hepatic function panel and basal metabolic panel.    Berna Spare MD 11/29/2014 10:35 AM     [facility] [address], [address], [place]  [address] Phone: [phone]; Fax: [phone]

## 2014-11-29 NOTE — Progress Notes (Signed)
Quick Note:  Please report to patient. The recent labs are stable. Continue same medication and careful diet. ______ 

## 2014-11-29 NOTE — Patient Instructions (Signed)
Medication Instructions:  START LOSARTAN 25 MG DAILY  Labwork: LP/BMET/HFP  BMET IN 2 WEEKS  Testing/Procedures: NONE  Follow-Up: Your physician recommends that you schedule a follow-up appointment in: 6 months with fasting labs (lp/bmet/hfp) WITH Tera Helper NP OR Nicki Reaper W PA

## 2014-12-13 ENCOUNTER — Other Ambulatory Visit (INDEPENDENT_AMBULATORY_CARE_PROVIDER_SITE_OTHER): Payer: Medicare Other

## 2014-12-13 DIAGNOSIS — I119 Hypertensive heart disease without heart failure: Secondary | ICD-10-CM

## 2014-12-13 DIAGNOSIS — E7800 Pure hypercholesterolemia, unspecified: Secondary | ICD-10-CM | POA: Diagnosis not present

## 2014-12-13 LAB — BASIC METABOLIC PANEL
BUN: 12 mg/dL (ref 7–25)
CHLORIDE: 103 mmol/L (ref 98–110)
CO2: 27 mmol/L (ref 20–31)
Calcium: 9.3 mg/dL (ref 8.6–10.4)
Creat: 0.67 mg/dL (ref 0.60–0.93)
Glucose, Bld: 91 mg/dL (ref 65–99)
POTASSIUM: 4 mmol/L (ref 3.5–5.3)
SODIUM: 140 mmol/L (ref 135–146)

## 2014-12-14 ENCOUNTER — Other Ambulatory Visit: Payer: Self-pay | Admitting: Obstetrics & Gynecology

## 2014-12-14 DIAGNOSIS — N6489 Other specified disorders of breast: Secondary | ICD-10-CM

## 2015-01-02 ENCOUNTER — Ambulatory Visit
Admission: RE | Admit: 2015-01-02 | Discharge: 2015-01-02 | Disposition: A | Discharge status: Home / Self Care | Payer: Medicare Other | Source: Ambulatory Visit | Attending: Obstetrics & Gynecology | Admitting: Obstetrics & Gynecology

## 2015-01-02 DIAGNOSIS — R928 Other abnormal and inconclusive findings on diagnostic imaging of breast: Secondary | ICD-10-CM | POA: Diagnosis not present

## 2015-01-02 DIAGNOSIS — N6489 Other specified disorders of breast: Secondary | ICD-10-CM

## 2015-05-12 ENCOUNTER — Other Ambulatory Visit: Payer: Self-pay | Admitting: Cardiology

## 2015-05-30 ENCOUNTER — Other Ambulatory Visit: Payer: Medicare Other

## 2015-05-30 ENCOUNTER — Ambulatory Visit: Payer: Medicare Other | Admitting: Physician Assistant

## 2015-06-04 ENCOUNTER — Telehealth: Payer: Self-pay | Admitting: *Deleted

## 2015-06-04 ENCOUNTER — Other Ambulatory Visit: Payer: Medicare Other

## 2015-06-04 ENCOUNTER — Ambulatory Visit (INDEPENDENT_AMBULATORY_CARE_PROVIDER_SITE_OTHER): Payer: Medicare Other | Admitting: Physician Assistant

## 2015-06-04 ENCOUNTER — Encounter: Payer: Self-pay | Admitting: Physician Assistant

## 2015-06-04 VITALS — BP 152/100 | HR 72 | Ht 64.0 in | Wt 135.1 lb

## 2015-06-04 DIAGNOSIS — E7800 Pure hypercholesterolemia, unspecified: Secondary | ICD-10-CM | POA: Diagnosis not present

## 2015-06-04 DIAGNOSIS — I119 Hypertensive heart disease without heart failure: Secondary | ICD-10-CM | POA: Diagnosis not present

## 2015-06-04 LAB — HEPATIC FUNCTION PANEL
ALK PHOS: 77 U/L (ref 33–130)
ALT: 16 U/L (ref 6–29)
AST: 22 U/L (ref 10–35)
Albumin: 4.6 g/dL (ref 3.6–5.1)
BILIRUBIN DIRECT: 0.1 mg/dL (ref <=0.2)
BILIRUBIN INDIRECT: 0.3 mg/dL (ref 0.2–1.2)
TOTAL PROTEIN: 7.5 g/dL (ref 6.1–8.1)
Total Bilirubin: 0.4 mg/dL (ref 0.2–1.2)

## 2015-06-04 LAB — BASIC METABOLIC PANEL
BUN: 12 mg/dL (ref 7–25)
CALCIUM: 9.9 mg/dL (ref 8.6–10.4)
CHLORIDE: 103 mmol/L (ref 98–110)
CO2: 29 mmol/L (ref 20–31)
Creat: 0.64 mg/dL (ref 0.60–0.93)
Glucose, Bld: 89 mg/dL (ref 65–99)
POTASSIUM: 4.1 mmol/L (ref 3.5–5.3)
SODIUM: 141 mmol/L (ref 135–146)

## 2015-06-04 LAB — LIPID PANEL
CHOL/HDL RATIO: 2.6 ratio (ref <=5.0)
Cholesterol: 182 mg/dL (ref 125–200)
HDL: 69 mg/dL (ref >=46)
LDL Cholesterol: 94 mg/dL (ref <130)
Triglycerides: 97 mg/dL (ref <150)
VLDL: 19 mg/dL (ref <30)

## 2015-06-04 NOTE — Progress Notes (Signed)
Cardiology Office Note:    Date:  06/04/2015   ID:  EVERETTE COSTENBADER, DOB 1945-12-30, MRN [id number]  PCP:  Harle Battiest, MD  Cardiologist:  Dr. Darlin Coco >> Richardson Dopp, PA-C / Dr. Liam Rogers   Electrophysiologist:  n/a  Referring MD: Harle Battiest, MD   Chief Complaint  Patient presents with  . Follow-up    Blood Pressure    History of Present Illness:     Misty Morales is a 70 y.o. female with a hx of HTN, HL. She plays competitive tennis. In 12/15, at a neighborhood Christmas party, she had an episode of syncope.  A 30 day event monitor demonstrated no significant arrhythmia. Her echocardiogram was normal. Last seen by Dr. Mare Ferrari 10/16. Losartan was added for BP control.  She returns for FU.    Doing well.  No complaints.  Still plays tennis.  The patient denies chest pain, shortness of breath, syncope, orthopnea, PND or significant pedal edema.    Past Medical History  Diagnosis Date  . Hypercholesterolemia   . Osteoporosis   . Hypertension   . Fracture 05/14/2007    Malunion, fracture, right ulna  . Mastitis     Right breast mastitis with septicemia, resolving.  Patient clinically stable on discharge  . Leukocytosis     probably secondary to #1, improved, needs outpatient follow-up.  . Diarrhea   . Clostridium difficile colitis   . Fever   . Chills   . Breast swelling     right breast swelling and pain    Past Surgical History  Procedure Laterality Date  . Rotator cuff repair  unknown  . Arm surg  arm surgery 2010    fractured right forearm     Current Medications: Outpatient Prescriptions Prior to Visit  Medication Sig Dispense Refill  . aspirin 81 MG tablet Take 81 mg by mouth daily.      . calcium-vitamin D (OSCAL WITH D) 500-200 MG-UNIT per tablet Take 1 tablet by mouth daily. Taking 750 bid    . Cholecalciferol (VITAMIN D PO) Take 1,000 Int'l Units by mouth daily.      . Glucosamine-Chondroitin (GLUCOSAMINE CHONDR COMPLEX PO) TAKE AS  DIRECTED    . GRAPE SEED EXTRACT PO Take 1 tablet by mouth daily.     Marland Kitchen losartan (COZAAR) 25 MG tablet Take 1 tablet (25 mg total) by mouth daily. 90 tablet 3  . Multiple Vitamin (MULTIVITAMIN) tablet Take 1 tablet by mouth daily.      . Omega-3 Fatty Acids (FISH OIL) 1000 MG CAPS Take by mouth daily.      . simvastatin (ZOCOR) 40 MG tablet TAKE ONE TABLET BY MOUTH AT BEDTIME 90 tablet 1  . Teriparatide, Recombinant, (FORTEO) 600 MCG/2.4ML SOLN Inject 20 mcg into the skin at bedtime.    . vitamin E 100 UNIT capsule Take 100 Units by mouth daily. Unsure of units     No facility-administered medications prior to visit.     Allergies:   Adhesive   Social History   Social History  . Marital Status: Married    Spouse Name: N/A  . Number of Children: N/A  . Years of Education: N/A   Social History Main Topics  . Smoking status: Never Smoker   . Smokeless tobacco: None  . Alcohol Use: Yes  . Drug Use: None  . Sexual Activity: Not Asked   Other Topics Concern  . None   Social History Narrative     Family History:  The patient's    family history includes Diabetes in her mother; Heart disease in her father and mother.   ROS:   Please see the history of present illness.    Review of Systems  Cardiovascular: Positive for irregular heartbeat.  Respiratory: Positive for cough.    All other systems reviewed and are negative.   Physical Exam:    VS:  BP 152/100 mmHg  Pulse 72  Ht 5\' 4"  (1.626 m)  Wt 135 lb 1.9 oz (61.29 kg)  BMI 23.18 kg/m2   GEN: Well nourished, well developed, in no acute distress HEENT: normal Neck: no JVD, no masses Cardiac: Normal S1/S2, RRR; no murmurs, rubs, or gallops, no edema;  no carotid bruits,   Respiratory:  clear to auscultation bilaterally; no wheezing, rhonchi or rales GI: soft, nontender, nondistended MS: no deformity or atrophy Skin: warm and dry Neuro: No focal deficits  Psych: Alert and oriented x 3, normal affect  Wt Readings from  Last 3 Encounters:  06/04/15 135 lb 1.9 oz (61.29 kg)  11/29/14 136 lb 1.9 oz (61.744 kg)  03/21/14 138 lb (62.596 kg)      Studies/Labs Reviewed:     EKG:  EKG is   ordered today.  The ekg ordered today demonstrates NSR, HR 72, normal axis, QTc 424 ms, no change since prior tracing  Recent Labs: 11/29/2014: ALT 20 12/13/2014: BUN 12; Creat 0.67; Potassium 4.0; Sodium 140   Recent Lipid Panel    Component Value Date/Time   CHOL 157 11/29/2014 0921   TRIG 55 11/29/2014 0921   HDL 65 11/29/2014 0921   CHOLHDL 2.4 11/29/2014 0921   VLDL 11 11/29/2014 0921   LDLCALC 81 11/29/2014 0921    Additional studies/ records that were reviewed today include:    Event Monitor 12/15 No sig arrhythmias  Echo 12/15 EF 0000000, grade 1 diastolic dysfunction, PASP 32 mmHg    ASSESSMENT:     1. Benign hypertensive heart disease without heart failure   2. Hypercholesterolemia     PLAN:     In order of problems listed above:  1. HTN - Last seen by Dr. Darlin Coco in 10/16 and Losartan started for BP control.  Her FU labs were normal.  BP elevated today but she has not had medications.  BP at home ranges 120-130/80s.  She will continue to monitor.  BMET today.  2. HL - On Simva 40 mg qhs.  Last LDL in 10/16 was 81.  Continue current Rx.  Repeat labs scheduled for today (Lipids/LFTs).     Medication Adjustments/Labs and Tests Ordered: Current medicines are reviewed at length with the patient today.  Concerns regarding medicines are outlined above.  Medication changes, Labs and Tests ordered today are outlined in the Patient Instructions noted below. Patient Instructions  Medication Instructions:  Your physician recommends that you continue on your current medications as directed. Please refer to the Current Medication list given to you today. Labwork: TODAY BMET, LIPID AND LIVER PANEL Testing/Procedures: NONE Follow-Up: Your physician wants you to follow-up in: [place], [place] . You will receive a reminder letter in the mail two months in advance. If you don't receive a letter, please call our office to schedule the follow-up appointment. Any Other Special Instructions Will Be Listed Below (If Applicable). If you need a refill on your cardiac medications before your next appointment, please call your pharmacy.   Signed, Richardson Dopp, PA-C  06/04/2015 10:46 AM  [facility] [address], [place], [place]  [address] Phone: [phone]; Fax: [phone]

## 2015-06-04 NOTE — Telephone Encounter (Signed)
Pt notified of lab results by phone with verbal understanding.  

## 2015-06-04 NOTE — Patient Instructions (Addendum)
Medication Instructions:  Your physician recommends that you continue on your current medications as directed. Please refer to the Current Medication list given to you today. Labwork: TODAY BMET, LIPID AND LIVER PANEL Testing/Procedures: NONE Follow-Up: Your physician wants you to follow-up in: [place], [place] . You will receive a reminder letter in the mail two months in advance. If you don't receive a letter, please call our office to schedule the follow-up appointment. Any Other Special Instructions Will Be Listed Below (If Applicable). If you need a refill on your cardiac medications before your next appointment, please call your pharmacy.

## 2015-06-07 ENCOUNTER — Other Ambulatory Visit: Payer: Self-pay | Admitting: Obstetrics & Gynecology

## 2015-06-07 DIAGNOSIS — N6489 Other specified disorders of breast: Secondary | ICD-10-CM

## 2015-06-25 DIAGNOSIS — Z6823 Body mass index (BMI) 23.0-23.9, adult: Secondary | ICD-10-CM | POA: Diagnosis not present

## 2015-06-25 DIAGNOSIS — Z01419 Encounter for gynecological examination (general) (routine) without abnormal findings: Secondary | ICD-10-CM | POA: Diagnosis not present

## 2015-07-02 ENCOUNTER — Ambulatory Visit
Admission: RE | Admit: 2015-07-02 | Discharge: 2015-07-02 | Disposition: A | Discharge status: Home / Self Care | Payer: Medicare Other | Source: Ambulatory Visit | Attending: Obstetrics & Gynecology | Admitting: Obstetrics & Gynecology

## 2015-07-02 DIAGNOSIS — R922 Inconclusive mammogram: Secondary | ICD-10-CM | POA: Diagnosis not present

## 2015-07-02 DIAGNOSIS — N6489 Other specified disorders of breast: Secondary | ICD-10-CM

## 2015-08-03 ENCOUNTER — Other Ambulatory Visit: Payer: Self-pay | Admitting: Endocrinology

## 2015-08-03 DIAGNOSIS — E049 Nontoxic goiter, unspecified: Secondary | ICD-10-CM | POA: Diagnosis not present

## 2015-08-03 DIAGNOSIS — M810 Age-related osteoporosis without current pathological fracture: Secondary | ICD-10-CM | POA: Diagnosis not present

## 2015-08-13 DIAGNOSIS — L821 Other seborrheic keratosis: Secondary | ICD-10-CM | POA: Diagnosis not present

## 2015-08-13 DIAGNOSIS — L820 Inflamed seborrheic keratosis: Secondary | ICD-10-CM | POA: Diagnosis not present

## 2015-08-14 ENCOUNTER — Ambulatory Visit
Admission: RE | Admit: 2015-08-14 | Discharge: 2015-08-14 | Disposition: A | Discharge status: Home / Self Care | Payer: Medicare Other | Source: Ambulatory Visit | Attending: Endocrinology | Admitting: Endocrinology

## 2015-08-14 DIAGNOSIS — E049 Nontoxic goiter, unspecified: Secondary | ICD-10-CM

## 2015-08-14 DIAGNOSIS — E042 Nontoxic multinodular goiter: Secondary | ICD-10-CM | POA: Diagnosis not present

## 2015-08-15 DIAGNOSIS — N958 Other specified menopausal and perimenopausal disorders: Secondary | ICD-10-CM | POA: Diagnosis not present

## 2015-08-15 DIAGNOSIS — M816 Localized osteoporosis [Lequesne]: Secondary | ICD-10-CM | POA: Diagnosis not present

## 2015-08-28 ENCOUNTER — Other Ambulatory Visit: Payer: Self-pay | Admitting: Endocrinology

## 2015-08-28 DIAGNOSIS — E049 Nontoxic goiter, unspecified: Secondary | ICD-10-CM

## 2015-09-06 ENCOUNTER — Ambulatory Visit
Admission: RE | Admit: 2015-09-06 | Discharge: 2015-09-06 | Disposition: A | Discharge status: Home / Self Care | Payer: Medicare Other | Source: Ambulatory Visit | Attending: Endocrinology | Admitting: Endocrinology

## 2015-09-06 ENCOUNTER — Other Ambulatory Visit: Payer: Self-pay | Admitting: Endocrinology

## 2015-09-06 DIAGNOSIS — E049 Nontoxic goiter, unspecified: Secondary | ICD-10-CM

## 2015-09-06 DIAGNOSIS — E042 Nontoxic multinodular goiter: Secondary | ICD-10-CM | POA: Diagnosis not present

## 2015-10-04 DIAGNOSIS — Z0000 Encounter for general adult medical examination without abnormal findings: Secondary | ICD-10-CM | POA: Diagnosis not present

## 2015-10-04 DIAGNOSIS — Z131 Encounter for screening for diabetes mellitus: Secondary | ICD-10-CM | POA: Diagnosis not present

## 2015-10-04 DIAGNOSIS — Z23 Encounter for immunization: Secondary | ICD-10-CM | POA: Diagnosis not present

## 2015-11-02 ENCOUNTER — Other Ambulatory Visit: Payer: Self-pay | Admitting: *Deleted

## 2015-11-02 MED ORDER — SIMVASTATIN 40 MG PO TABS
40.0000 mg | ORAL_TABLET | Freq: Every day | ORAL | 3 refills | Status: DC
Start: 2015-11-02 — End: 2016-11-04

## 2015-11-22 DIAGNOSIS — H25813 Combined forms of age-related cataract, bilateral: Secondary | ICD-10-CM | POA: Diagnosis not present

## 2015-11-22 DIAGNOSIS — H524 Presbyopia: Secondary | ICD-10-CM | POA: Diagnosis not present

## 2016-01-09 DIAGNOSIS — B029 Zoster without complications: Secondary | ICD-10-CM | POA: Diagnosis not present

## 2016-02-04 ENCOUNTER — Other Ambulatory Visit: Payer: Self-pay | Admitting: *Deleted

## 2016-02-04 MED ORDER — LOSARTAN POTASSIUM 25 MG PO TABS: 25.0000 mg | ORAL_TABLET | Freq: Every day | ORAL | 0 refills | Status: DC

## 2016-05-26 ENCOUNTER — Encounter: Payer: Self-pay | Admitting: Physician Assistant

## 2016-05-26 ENCOUNTER — Ambulatory Visit (INDEPENDENT_AMBULATORY_CARE_PROVIDER_SITE_OTHER): Payer: Medicare Other | Admitting: Physician Assistant

## 2016-05-26 VITALS — BP 130/90 | HR 78 | Ht 63.0 in | Wt 139.4 lb

## 2016-05-26 DIAGNOSIS — I119 Hypertensive heart disease without heart failure: Secondary | ICD-10-CM | POA: Diagnosis not present

## 2016-05-26 DIAGNOSIS — E7800 Pure hypercholesterolemia, unspecified: Secondary | ICD-10-CM | POA: Diagnosis not present

## 2016-05-26 NOTE — Patient Instructions (Addendum)
Medication Instructions:  No changes   Labwork: Today - CMET, Lipids  Testing/Procedures: None   Follow-Up: Your physician wants you to follow-up in: [place],, [facility] You will receive a reminder letter in the mail two months in advance. If you don't receive a letter, please call our office to schedule the follow-up appointment.   Any Other Special Instructions Will Be Listed Below (If Applicable).  If you need a refill on your cardiac medications before your next appointment, please call your pharmacy.

## 2016-05-26 NOTE — Progress Notes (Signed)
Cardiology Office Note:    Date:  05/26/2016   ID:  SHARA HARTIS, DOB 1945/10/25, MRN [id number]  PCP:  Melinda Crutch, MD  Cardiologist:  Dr. Darlin Coco >> Richardson Dopp, PA-C / Dr. Liam Rogers    Electrophysiologist:  n/a  Referring MD: Lawerance Cruel, MD   Chief Complaint  Patient presents with  . Follow-up    Hypertension, Hyperlipidemia    History of Present Illness:    Misty Morales is a 71 y.o. female with a hx of HTN, HL. She plays competitive tennis. In 12/15, at a neighborhood Christmas party, she had an episode of syncope.  A 30 day event monitor demonstrated no significant arrhythmia. Her echocardiogram was normal. Last seen in 4/17. She returns for follow-up.  She is here alone.  Her husband has undergone CABG since I last saw her.  He is followed by Dr. Glori Bickers and Dr. Prescott Gum.  He is doing better and they just got a puppy.  Mrs. Mauro is doing well.  She still plays tennis.  Since last seen, she denies chest pain, shortness of breath, syncope, orthopnea, PND or significant pedal edema.   Prior CV studies:   The following studies were reviewed today:  Event Monitor 12/15 No sig arrhythmias   Echo 12/15 EF 76-73%, grade 1 diastolic dysfunction, PASP 32 mmHg  Past Medical History:  Diagnosis Date  . Breast swelling    right breast swelling and pain  . Chills   . Clostridium difficile colitis   . Diarrhea   . Fever   . Fracture 05/14/2007   Malunion, fracture, right ulna  . Hypercholesterolemia   . Hypertension   . Leukocytosis    probably secondary to #1, improved, needs outpatient follow-up.  . Mastitis    Right breast mastitis with septicemia, resolving.  Patient clinically stable on discharge  . Osteoporosis     Past Surgical History:  Procedure Laterality Date  . arm surg  arm surgery 2010   fractured right forearm   . ROTATOR CUFF REPAIR  unknown    Current Medications: Current Meds  Medication Sig  . aspirin 81 MG  tablet Take 81 mg by mouth daily.    . calcium-vitamin D (OSCAL WITH D) 500-200 MG-UNIT per tablet Take 1 tablet by mouth daily. Taking 750 bid  . Cholecalciferol (VITAMIN D PO) Take 1,000 Int'l Units by mouth daily.    . Glucosamine-Chondroitin (GLUCOSAMINE CHONDR COMPLEX PO) TAKE AS DIRECTED  . GRAPE SEED EXTRACT PO Take 1 tablet by mouth daily.   Marland Kitchen losartan (COZAAR) 25 MG tablet Take 1 tablet (25 mg total) by mouth daily.  . Multiple Vitamin (MULTIVITAMIN) tablet Take 1 tablet by mouth daily.    . Omega-3 Fatty Acids (FISH OIL) 1000 MG CAPS Take by mouth daily.    . simvastatin (ZOCOR) 40 MG tablet Take 1 tablet (40 mg total) by mouth at bedtime.  . vitamin E 100 UNIT capsule Take 100 Units by mouth daily. Unsure of units     Allergies:   Adhesive [tape]   Social History   Social History  . Marital status: Married    Spouse name: N/A  . Number of children: N/A  . Years of education: N/A   Social History Main Topics  . Smoking status: Never Smoker  . Smokeless tobacco: Never Used  . Alcohol use Yes  . Drug use: Unknown  . Sexual activity: Not Asked   Other Topics Concern  . None  Social History Narrative  . None     Family History  Problem Relation Age of Onset  . Heart disease Mother   . Diabetes Mother   . Heart disease Father      ROS:   Please see the history of present illness.    ROS All other systems reviewed and are negative.   EKGs/Labs/Other Test Reviewed:    EKG:  EKG is  ordered today.  The ekg ordered today demonstrates NSR, HR 78, LAD, QTc 424 ms, no changes.   Recent Labs: 06/04/2015: ALT 16; BUN 12; Creat 0.64; Potassium 4.1; Sodium 141   Recent Lipid Panel    Component Value Date/Time   CHOL 182 06/04/2015 1047   TRIG 97 06/04/2015 1047   HDL 69 06/04/2015 1047   CHOLHDL 2.6 06/04/2015 1047   VLDL 19 06/04/2015 1047   LDLCALC 94 06/04/2015 1047    Physical Exam:    VS:  BP 130/90   Pulse 78   Ht 5\' 3"  (1.6 m)   Wt 139 lb 6.4 oz  (63.2 kg)   BMI 24.69 kg/m     Wt Readings from Last 3 Encounters:  05/26/16 139 lb 6.4 oz (63.2 kg)  06/04/15 135 lb 1.9 oz (61.3 kg)  11/29/14 136 lb 1.9 oz (61.7 kg)     Physical Exam  Constitutional: She is oriented to person, place, and time. She appears well-developed and well-nourished. No distress.  HENT:  Head: Normocephalic and atraumatic.  Eyes: No scleral icterus.  Neck: Normal range of motion. No JVD present. Carotid bruit is not present.  Cardiovascular: Normal rate, regular rhythm, S1 normal, S2 normal and normal heart sounds.   No murmur heard. Pulmonary/Chest: Breath sounds normal. She has no wheezes. She has no rhonchi. She has no rales.  Abdominal: Soft. There is no tenderness.  Musculoskeletal: She exhibits no edema.  Neurological: She is alert and oriented to person, place, and time.  Skin: Skin is warm and dry.  Psychiatric: She has a normal mood and affect.    ASSESSMENT:    1. Benign hypertensive heart disease without heart failure   2. Hypercholesterolemia    PLAN:    In order of problems listed above:  1. Benign hypertensive heart disease without heart failure - Her BP at home is optimal.  It is always higher here.  Continue current Rx plan.  CMET today.   2. Hypercholesterolemia - Continue statin. LDL in 4/17 was 94.  CMET, Lipids today.    Dispo:  Return in about 1 year (around 05/26/2017) for w/ Richardson Dopp, PA-C.   Medication Adjustments/Labs and Tests Ordered: Current medicines are reviewed at length with the patient today.  Concerns regarding medicines are outlined above.  Medication changes, Labs and Tests ordered today are outlined in the Patient Instructions noted below. Patient Instructions  Medication Instructions:  No changes   Labwork: Today - CMET, Lipids  Testing/Procedures: None   Follow-Up: Your physician wants you to follow-up in: [place],, [facility] You will receive a reminder letter in the mail two months  in advance. If you don't receive a letter, please call our office to schedule the follow-up appointment.   Any Other Special Instructions Will Be Listed Below (If Applicable).  If you need a refill on your cardiac medications before your next appointment, please call your pharmacy.   Signed, Richardson Dopp, PA-C  05/26/2016 10:40 AM    [place],  [place]  [address] Phone: [phone]; Fax: [phone]

## 2016-05-27 ENCOUNTER — Other Ambulatory Visit: Payer: Self-pay | Admitting: Cardiovascular Disease

## 2016-05-27 LAB — COMPREHENSIVE METABOLIC PANEL
ALBUMIN: 4.4 g/dL (ref 3.5–4.8)
ALT: 17 IU/L (ref 0–32)
AST: 23 IU/L (ref 0–40)
Albumin/Globulin Ratio: 1.8 (ref 1.2–2.2)
Alkaline Phosphatase: 73 IU/L (ref 39–117)
BUN / CREAT RATIO: 18 (ref 12–28)
BUN: 12 mg/dL (ref 8–27)
Bilirubin Total: 0.3 mg/dL (ref 0.0–1.2)
CO2: 28 mmol/L (ref 18–29)
CREATININE: 0.65 mg/dL (ref 0.57–1.00)
Calcium: 9.2 mg/dL (ref 8.7–10.3)
Chloride: 101 mmol/L (ref 96–106)
GFR calc non Af Amer: 88 mL/min/{1.73_m2} (ref >59)
GFR, EST AFRICAN AMERICAN: 102 mL/min/{1.73_m2} (ref >59)
GLOBULIN, TOTAL: 2.4 g/dL (ref 1.5–4.5)
Glucose: 88 mg/dL (ref 65–99)
Potassium: 4.4 mmol/L (ref 3.5–5.2)
SODIUM: 141 mmol/L (ref 134–144)
Total Protein: 6.8 g/dL (ref 6.0–8.5)

## 2016-05-27 LAB — LIPID PANEL
Chol/HDL Ratio: 2.7 ratio (ref 0.0–4.4)
Cholesterol, Total: 189 mg/dL (ref 100–199)
HDL: 70 mg/dL (ref >39)
LDL CALC: 102 mg/dL — AB (ref 0–99)
Triglycerides: 83 mg/dL (ref 0–149)
VLDL Cholesterol Cal: 17 mg/dL (ref 5–40)

## 2016-05-28 ENCOUNTER — Telehealth: Payer: Self-pay | Admitting: *Deleted

## 2016-05-28 NOTE — Telephone Encounter (Signed)
Pt notified of lab results by phone with verbal understanding to results given.  Pt aware to continue on current Tx plan. Pt thanked me for my call today.

## 2016-05-28 NOTE — Telephone Encounter (Signed)
-----   Message from Liliane Shi, [place] sent at 05/28/2016  4:55 PM EDT ----- Please call the patient Lipids ok Kidney function and liver function are normal. All other parameters are within acceptable limits and no further intervention or testing required  Continue with current treatment plan. Richardson Dopp, PA-C   05/28/2016 4:55 PM

## 2016-05-29 DIAGNOSIS — S63637A Sprain of interphalangeal joint of left little finger, initial encounter: Secondary | ICD-10-CM | POA: Diagnosis not present

## 2016-07-15 DIAGNOSIS — Z6823 Body mass index (BMI) 23.0-23.9, adult: Secondary | ICD-10-CM | POA: Diagnosis not present

## 2016-07-15 DIAGNOSIS — Z124 Encounter for screening for malignant neoplasm of cervix: Secondary | ICD-10-CM | POA: Diagnosis not present

## 2016-08-01 DIAGNOSIS — M810 Age-related osteoporosis without current pathological fracture: Secondary | ICD-10-CM | POA: Diagnosis not present

## 2016-08-01 DIAGNOSIS — E049 Nontoxic goiter, unspecified: Secondary | ICD-10-CM | POA: Diagnosis not present

## 2016-08-01 DIAGNOSIS — E559 Vitamin D deficiency, unspecified: Secondary | ICD-10-CM | POA: Diagnosis not present

## 2016-08-01 DIAGNOSIS — I10 Essential (primary) hypertension: Secondary | ICD-10-CM | POA: Diagnosis not present

## 2016-09-04 ENCOUNTER — Other Ambulatory Visit: Payer: Self-pay | Admitting: Obstetrics & Gynecology

## 2016-09-04 DIAGNOSIS — N6489 Other specified disorders of breast: Secondary | ICD-10-CM

## 2016-09-11 ENCOUNTER — Ambulatory Visit
Admission: RE | Admit: 2016-09-11 | Discharge: 2016-09-11 | Disposition: A | Discharge status: Home / Self Care | Payer: Medicare Other | Source: Ambulatory Visit | Attending: Obstetrics & Gynecology | Admitting: Obstetrics & Gynecology

## 2016-09-11 ENCOUNTER — Encounter: Payer: Self-pay | Admitting: Radiology

## 2016-09-11 DIAGNOSIS — N6489 Other specified disorders of breast: Secondary | ICD-10-CM

## 2016-09-11 DIAGNOSIS — R922 Inconclusive mammogram: Secondary | ICD-10-CM | POA: Diagnosis not present

## 2016-11-04 ENCOUNTER — Other Ambulatory Visit: Payer: Self-pay | Admitting: Physician Assistant

## 2016-11-20 DIAGNOSIS — Z0000 Encounter for general adult medical examination without abnormal findings: Secondary | ICD-10-CM | POA: Diagnosis not present

## 2016-11-20 DIAGNOSIS — Z23 Encounter for immunization: Secondary | ICD-10-CM | POA: Diagnosis not present

## 2016-12-04 DIAGNOSIS — H2513 Age-related nuclear cataract, bilateral: Secondary | ICD-10-CM | POA: Diagnosis not present

## 2016-12-04 DIAGNOSIS — H25013 Cortical age-related cataract, bilateral: Secondary | ICD-10-CM | POA: Diagnosis not present

## 2016-12-04 DIAGNOSIS — H25033 Anterior subcapsular polar age-related cataract, bilateral: Secondary | ICD-10-CM | POA: Diagnosis not present

## 2017-05-01 ENCOUNTER — Other Ambulatory Visit: Payer: Self-pay | Admitting: Physician Assistant

## 2017-05-28 NOTE — Progress Notes (Signed)
Cardiology Office Note:    Date:  05/29/2017   ID:  Misty Morales, DOB 08/18/1945, MRN [id number]  PCP:  Misty Cruel, MD  Cardiologist:  Misty Moores, MD / Misty Dopp, PA-C   Referring MD: Misty Cruel, MD   Chief Complaint  Patient presents with  . Follow-up    Hypertension, hyperlipidemia    History of Present Illness:    Misty Morales is a 72 y.o. female with HTN, HL. She plays competitive tennis. In 12/15, at a neighborhood Christmas party, she had an episode of syncope. A 30 day event monitor demonstrated no significant arrhythmia. Her echocardiogram was normal.   Misty Morales returns for follow-up.  She is here alone.  She has been doing well.  She continues to play competitive tennis 2-3 times a week.  She denies chest pain, jaw pain, arm pain, shortness of breath, syncope, orthopnea, PND, edema.  She did have a URI about 1 month ago.  She had a cough and some associated diarrhea with her illness.  Her symptoms of all resolved.    Prior CV studies:   The following studies were reviewed today:  Event Monitor 12/15 No sig arrhythmias   Echo 12/15 EF 82-95%, grade 1 diastolic dysfunction, PASP 32 mmHg   Past Medical History:  Diagnosis Date  . Breast swelling    right breast swelling and pain  . Chills   . Clostridium difficile colitis   . Diarrhea   . Fever   . Fracture 05/14/2007   Malunion, fracture, right ulna  . Hypercholesterolemia   . Hypertension   . Leukocytosis    probably secondary to #1, improved, needs outpatient follow-up.  . Mastitis    Right breast mastitis with septicemia, resolving.  Patient clinically stable on discharge  . Osteoporosis    Surgical Hx: The patient  has a past surgical history that includes Rotator cuff repair (unknown) and arm surg (arm surgery 2010).   Current Medications: Current Meds  Medication Sig  . calcium-vitamin D (OSCAL WITH D) 500-200 MG-UNIT per tablet Take 1 tablet by mouth daily. Taking 750  bid  . Cholecalciferol (VITAMIN D PO) Take 1,000 Int'l Units by mouth daily.    . Glucosamine-Chondroitin (GLUCOSAMINE CHONDR COMPLEX PO) TAKE AS DIRECTED  . GRAPE SEED EXTRACT PO Take 1 tablet by mouth daily.   Marland Kitchen losartan (COZAAR) 25 MG tablet TAKE 1 TABLET BY MOUTH ONCE DAILY  . Multiple Vitamin (MULTIVITAMIN) tablet Take 1 tablet by mouth daily.    . Omega-3 Fatty Acids (FISH OIL) 1000 MG CAPS Take by mouth as directed.  . simvastatin (ZOCOR) 40 MG tablet TAKE ONE TABLET BY MOUTH AT BEDTIME  . vitamin E 100 UNIT capsule Take 100 Units by mouth daily. Unsure of units     Allergies:   Adhesive [tape]   Social History   Tobacco Use  . Smoking status: Never Smoker  . Smokeless tobacco: Never Used  Substance Use Topics  . Alcohol use: Yes  . Drug use: Not on file     Family Hx: The patient's family history includes Diabetes in her mother; Heart disease in her father and mother.  ROS:   Please see the history of present illness.    ROS All other systems reviewed and are negative.   EKGs/Labs/Other Test Reviewed:    EKG:  EKG is  ordered today.  The ekg ordered today demonstrates normal sinus rhythm, heart rate 70, normal axis, T wave inversions 1, 2, aVL,  aVF (new since last tracing), QTC 408  Recent Labs: No results found for requested labs within last 8760 hours.   Recent Lipid Panel Lab Results  Component Value Date/Time   CHOL 189 05/26/2016 11:35 AM   TRIG 83 05/26/2016 11:35 AM   HDL 70 05/26/2016 11:35 AM   CHOLHDL 2.7 05/26/2016 11:35 AM   CHOLHDL 2.6 06/04/2015 10:47 AM   LDLCALC 102 (H) 05/26/2016 11:35 AM    Physical Exam:    VS:  BP 108/80   Pulse 77   Ht '5\' 3"'  (1.6 m)   Wt 136 lb (61.7 kg)   SpO2 97%   BMI 24.09 kg/m     Wt Readings from Last 3 Encounters:  05/29/17 136 lb (61.7 kg)  05/26/16 139 lb 6.4 oz (63.2 kg)  06/04/15 135 lb 1.9 oz (61.3 kg)     Physical Exam  Constitutional: She is oriented to person, place, and time. She appears  well-developed and well-nourished. No distress.  HENT:  Head: Normocephalic and atraumatic.  Neck: No JVD present. Carotid bruit is not present.  Cardiovascular: Normal rate and regular rhythm.  No murmur heard. Pulmonary/Chest: Effort normal. She has no rales.  Abdominal: Soft.  Musculoskeletal: She exhibits no edema.  Neurological: She is alert and oriented to person, place, and time.  Skin: Skin is warm and dry.    ASSESSMENT & PLAN:    #1.  Abnormal EKG ECG today is markedly abnormal with inferolateral T wave inversions.  However, she remains quite active without chest pain or shortness of breath.  She does have a remote family history of heart disease.  Etiology of her abnormal ECG is not entirely clear.  However, I think it warrants further evaluation.  A plain exercise treadmill test will likely not be helpful given her T wave inversions.  She will need an imaging study.  She also needs an echo to rule out structural heart disease, wall motion abnormalities.  -BMET, magnesium, TSH  -Exercise nuclear stress test  -Echocardiogram  -Continue aspirin 81 daily for now (she had stopped due to recent info re: primary prevention)  -Consider earlier follow-up depending upon results of stress test and echo  #2.  Benign hypertensive heart disease without heart failure The patient's blood pressure is controlled on her current regimen.  Continue current therapy.   #3.  Hypercholesterolemia Obtain follow-up lipids and LFTs, TSH.  Continue simvastatin.   Dispo:  Return in about 6 months (around 11/28/2017) for Routine Follow Up, w/ Misty Dopp, PA-C.   Medication Adjustments/Labs and Tests Ordered: Current medicines are reviewed at length with the patient today.  Concerns regarding medicines are outlined above.  Tests Ordered: Orders Placed This Encounter  Procedures  . Magnesium  . TSH  . Comp Met (CMET)  . Lipid Profile  . Myocardial Perfusion Imaging  . EKG 12-Lead  .  ECHOCARDIOGRAM COMPLETE   Medication Changes: Meds ordered this encounter  Medications  . aspirin EC 81 MG tablet    Sig: Take 1 tablet (81 mg total) by mouth daily.    Signed, Misty Dopp, PA-C  05/29/2017 9:10 AM    [facility] [place], [place], [place]  [address] Phone: [phone]; Fax: [phone]

## 2017-05-29 ENCOUNTER — Ambulatory Visit (INDEPENDENT_AMBULATORY_CARE_PROVIDER_SITE_OTHER): Payer: Medicare Other | Admitting: Physician Assistant

## 2017-05-29 ENCOUNTER — Encounter: Payer: Self-pay | Admitting: *Deleted

## 2017-05-29 ENCOUNTER — Encounter: Payer: Self-pay | Admitting: Physician Assistant

## 2017-05-29 VITALS — BP 108/80 | HR 77 | Ht 63.0 in | Wt 136.0 lb

## 2017-05-29 DIAGNOSIS — R9431 Abnormal electrocardiogram [ECG] [EKG]: Secondary | ICD-10-CM

## 2017-05-29 DIAGNOSIS — I119 Hypertensive heart disease without heart failure: Secondary | ICD-10-CM | POA: Diagnosis not present

## 2017-05-29 DIAGNOSIS — E7800 Pure hypercholesterolemia, unspecified: Secondary | ICD-10-CM | POA: Diagnosis not present

## 2017-05-29 LAB — MAGNESIUM: Magnesium: 2.3 mg/dL (ref 1.6–2.3)

## 2017-05-29 LAB — LIPID PANEL
Chol/HDL Ratio: 3.3 ratio (ref 0.0–4.4)
Cholesterol, Total: 183 mg/dL (ref 100–199)
HDL: 55 mg/dL (ref >39)
LDL CALC: 110 mg/dL — AB (ref 0–99)
Triglycerides: 90 mg/dL (ref 0–149)
VLDL CHOLESTEROL CAL: 18 mg/dL (ref 5–40)

## 2017-05-29 LAB — COMPREHENSIVE METABOLIC PANEL WITH GFR
ALT: 20 [IU]/L (ref 0–32)
AST: 22 [IU]/L (ref 0–40)
Albumin/Globulin Ratio: 2 (ref 1.2–2.2)
Albumin: 4.4 g/dL (ref 3.5–4.8)
Alkaline Phosphatase: 81 [IU]/L (ref 39–117)
BUN/Creatinine Ratio: 24 (ref 12–28)
BUN: 17 mg/dL (ref 8–27)
Bilirubin Total: 0.4 mg/dL (ref 0.0–1.2)
CO2: 24 mmol/L (ref 20–29)
Calcium: 9.2 mg/dL (ref 8.7–10.3)
Chloride: 103 mmol/L (ref 96–106)
Creatinine, Ser: 0.71 mg/dL (ref 0.57–1.00)
GFR calc Af Amer: 97 mL/min/{1.73_m2}
GFR calc non Af Amer: 84 mL/min/{1.73_m2}
Globulin, Total: 2.2 g/dL (ref 1.5–4.5)
Glucose: 98 mg/dL (ref 65–99)
Potassium: 4.6 mmol/L (ref 3.5–5.2)
Sodium: 141 mmol/L (ref 134–144)
Total Protein: 6.6 g/dL (ref 6.0–8.5)

## 2017-05-29 LAB — TSH: TSH: 1.33 u[IU]/mL (ref 0.450–4.500)

## 2017-05-29 MED ORDER — ASPIRIN EC 81 MG PO TBEC: 81.0000 mg | DELAYED_RELEASE_TABLET | Freq: Every day | ORAL | Status: DC

## 2017-05-29 NOTE — Patient Instructions (Signed)
Medication Instructions:  1. RESTART ASPIRIN 81 MG DAILY  Labwork: 1. TODAY CMET, LIPIDS, TSH, MAGNESIUM LEVEL  Testing/Procedures: 1. Your physician has requested that you have an echocardiogram. Echocardiography is a painless test that uses sound waves to create images of your heart. It provides your doctor with information about the size and shape of your heart and how well your heart's chambers and valves are working. This procedure takes approximately one hour. There are no restrictions for this procedure.  2. Your physician has requested that you have en exercise stress myoview. For further information please visit [email or web address]. Please follow instruction sheet, as given.    Follow-Up: [facility], [facility] IN 6 MONTHS     Any Other Special Instructions Will Be Listed Below (If Applicable).     If you need a refill on your cardiac medications before your next appointment, please call your pharmacy.

## 2017-06-08 ENCOUNTER — Telehealth (HOSPITAL_COMMUNITY): Payer: Self-pay | Admitting: *Deleted

## 2017-06-08 NOTE — Telephone Encounter (Signed)
Left message on voicemail per DPR in reference to upcoming appointment scheduled on 06/11/17 with detailed instructions given per Myocardial Perfusion Study Information Sheet for the test. LM to arrive 15 minutes early, and that it is imperative to arrive on time for appointment to keep from having the test rescheduled. If you need to cancel or reschedule your appointment, please call the office within 24 hours of your appointment. Failure to do so may result in a cancellation of your appointment, and a $50 no show fee. Phone number given for call back for any questions. Kirstie Peri

## 2017-06-11 ENCOUNTER — Ambulatory Visit (HOSPITAL_BASED_OUTPATIENT_CLINIC_OR_DEPARTMENT_OTHER): Payer: Medicare Other

## 2017-06-11 ENCOUNTER — Encounter: Payer: Self-pay | Admitting: Physician Assistant

## 2017-06-11 ENCOUNTER — Other Ambulatory Visit: Payer: Self-pay

## 2017-06-11 ENCOUNTER — Ambulatory Visit (HOSPITAL_COMMUNITY): Payer: Medicare Other | Attending: Internal Medicine

## 2017-06-11 DIAGNOSIS — E785 Hyperlipidemia, unspecified: Secondary | ICD-10-CM | POA: Insufficient documentation

## 2017-06-11 DIAGNOSIS — R9439 Abnormal result of other cardiovascular function study: Secondary | ICD-10-CM | POA: Insufficient documentation

## 2017-06-11 DIAGNOSIS — R9431 Abnormal electrocardiogram [ECG] [EKG]: Secondary | ICD-10-CM

## 2017-06-11 DIAGNOSIS — I340 Nonrheumatic mitral (valve) insufficiency: Secondary | ICD-10-CM | POA: Insufficient documentation

## 2017-06-11 DIAGNOSIS — I119 Hypertensive heart disease without heart failure: Secondary | ICD-10-CM | POA: Insufficient documentation

## 2017-06-11 LAB — MYOCARDIAL PERFUSION IMAGING
CHL CUP NUCLEAR SDS: 5
CSEPPHR: 133 {beats}/min
Estimated workload: 9.3 METS
Exercise duration (min): 7 min
Exercise duration (sec): 30 s
LHR: 0.31
LV dias vol: 71 mL (ref 46–106)
LVSYSVOL: 28 mL
MPHR: 146 {beats}/min
Percent HR: 91 %
RPE: 19
Rest HR: 73 {beats}/min
SRS: 5
SSS: 10
TID: 0.85

## 2017-06-11 MED ORDER — TECHNETIUM TC 99M TETROFOSMIN IV KIT
31.1000 | PACK | Freq: Once | INTRAVENOUS | Status: AC | PRN
  Administered 2017-06-11: 31.1 via INTRAVENOUS
  Filled 2017-06-11: qty 32

## 2017-06-11 MED ORDER — TECHNETIUM TC 99M TETROFOSMIN IV KIT
10.1000 | PACK | Freq: Once | INTRAVENOUS | Status: AC | PRN
  Administered 2017-06-11: 10.1 via INTRAVENOUS
  Filled 2017-06-11: qty 11

## 2017-07-21 ENCOUNTER — Other Ambulatory Visit: Payer: Self-pay | Admitting: Endocrinology

## 2017-07-21 DIAGNOSIS — E049 Nontoxic goiter, unspecified: Secondary | ICD-10-CM

## 2017-07-25 IMAGING — MG MM DIAG BREAST TOMO BILATERAL
8 of 12 series · 8 of 28 positions shown · non-contrast
Comparison: Previous exams including right breast diagnostic
mammogram dated 01/02/2015.

CLINICAL DATA: Six-month follow-up for probably benign asymmetry
within the right breast.

EXAM:
2D DIGITAL DIAGNOSTIC BILATERAL MAMMOGRAM WITH CAD AND ADJUNCT TOMO

[L MLO]
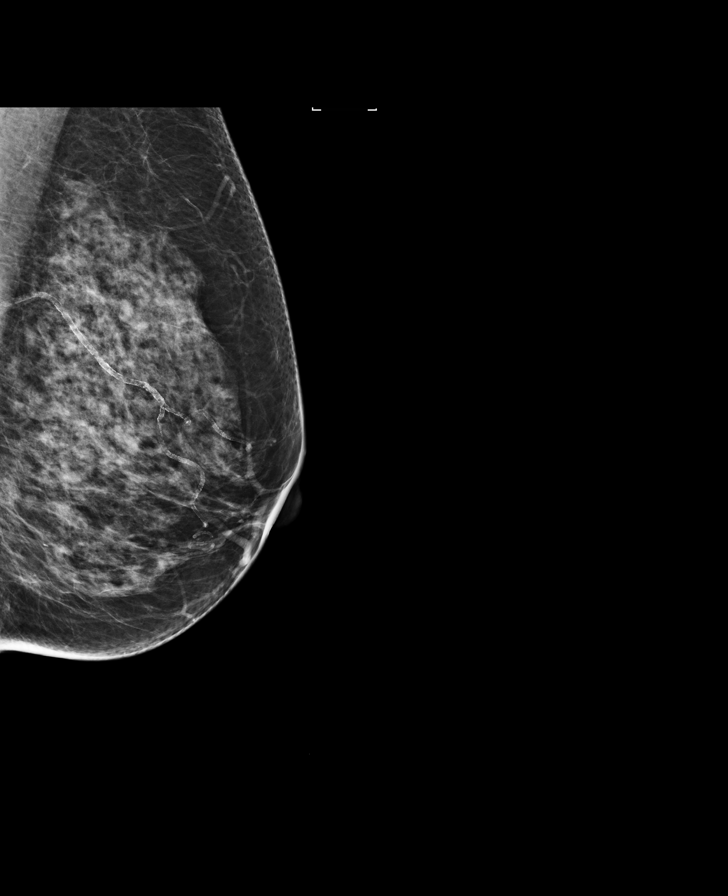

[R MLO]
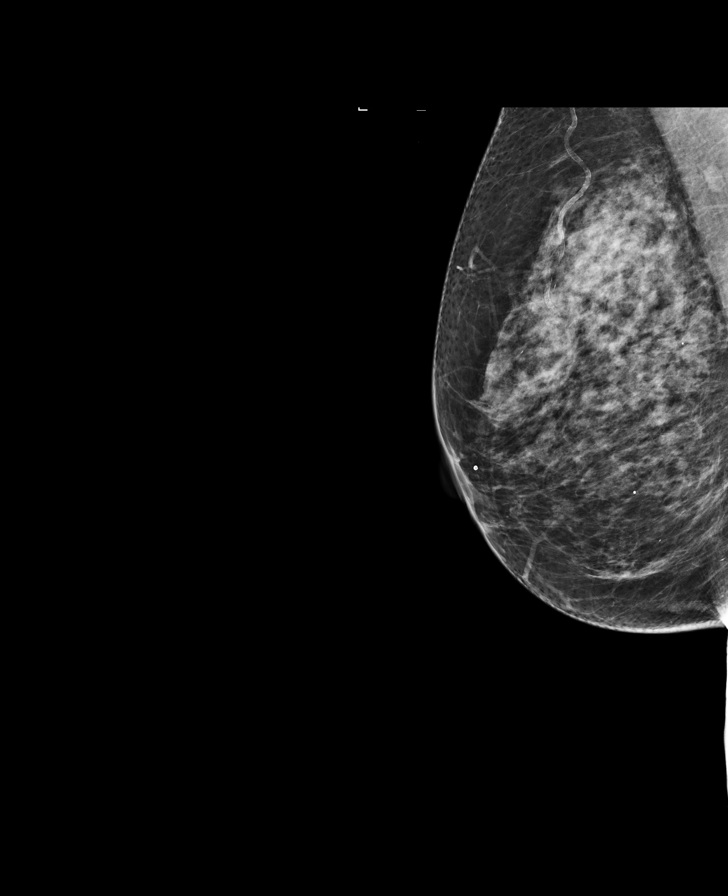

[R MLO synth-2D]
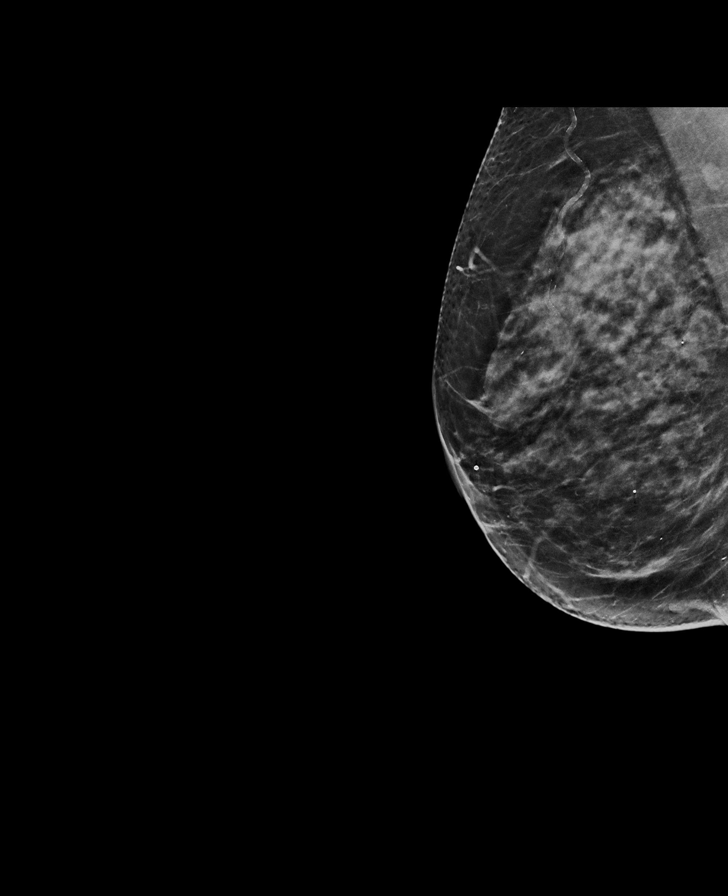

[L CC]
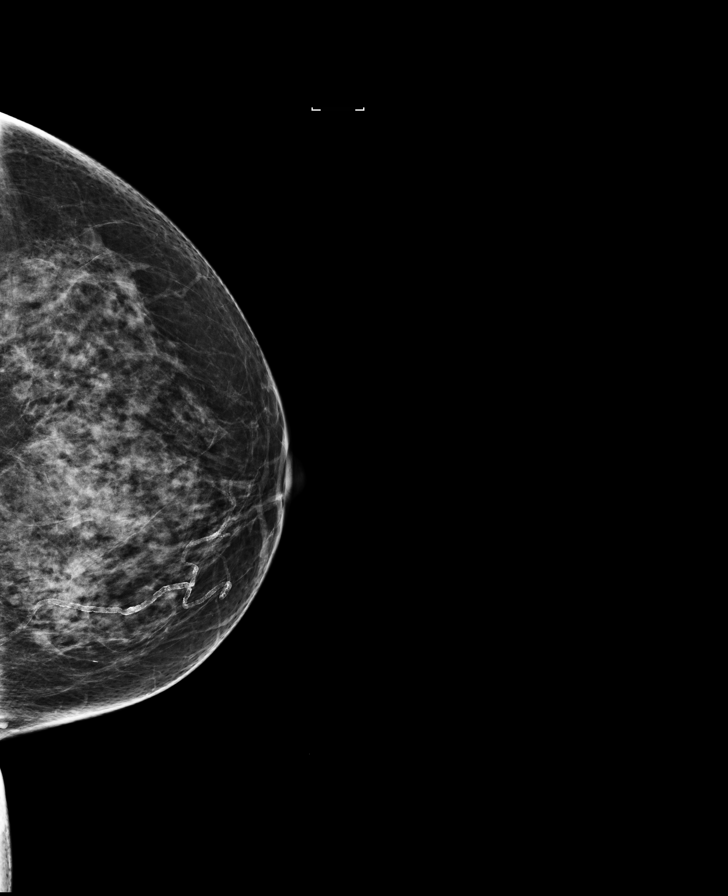

[L MLO synth-2D]
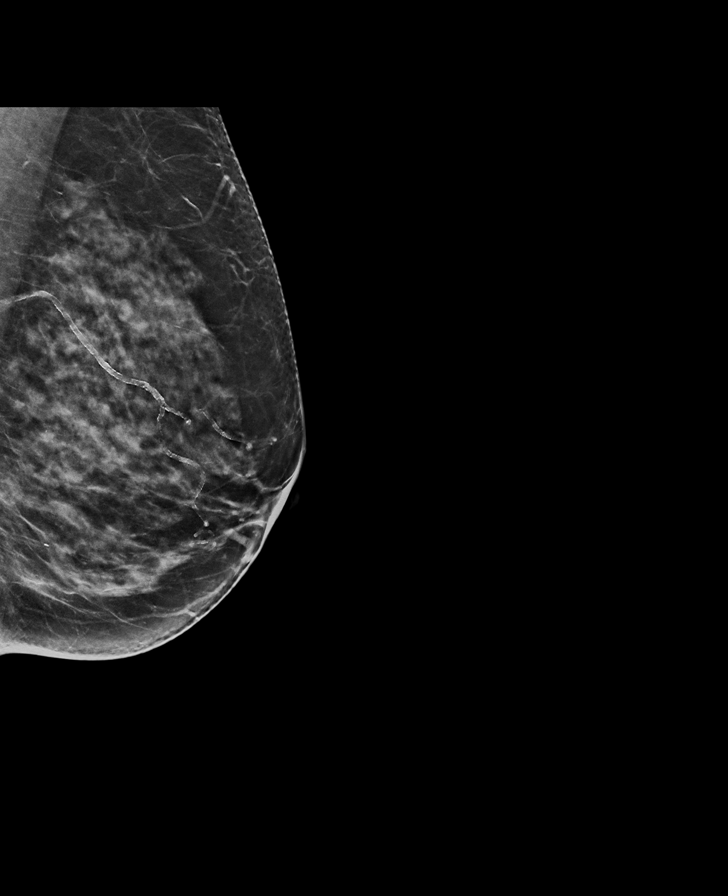

[R CC]
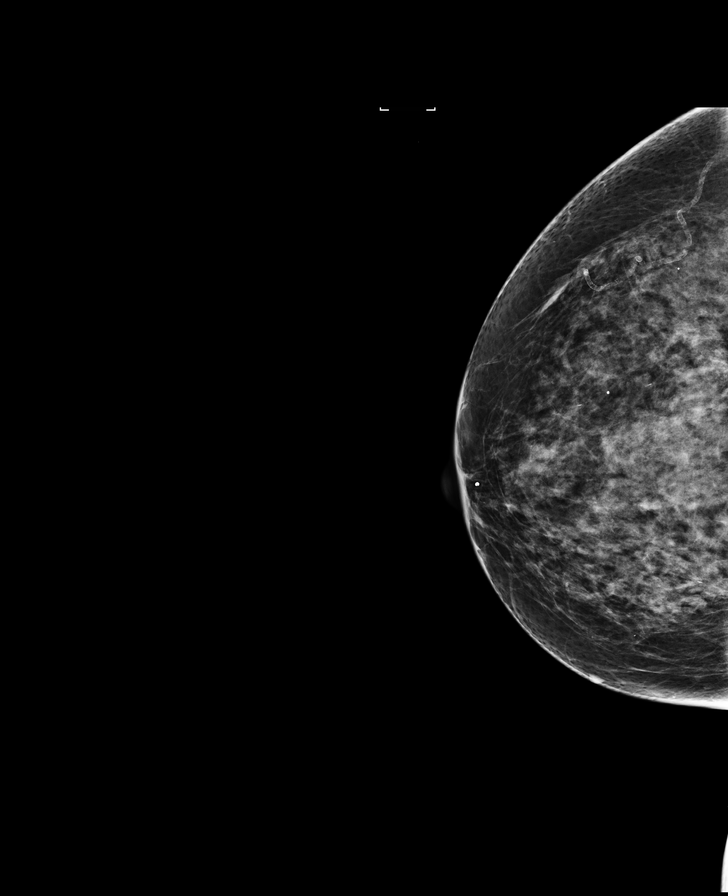

[L CC synth-2D]
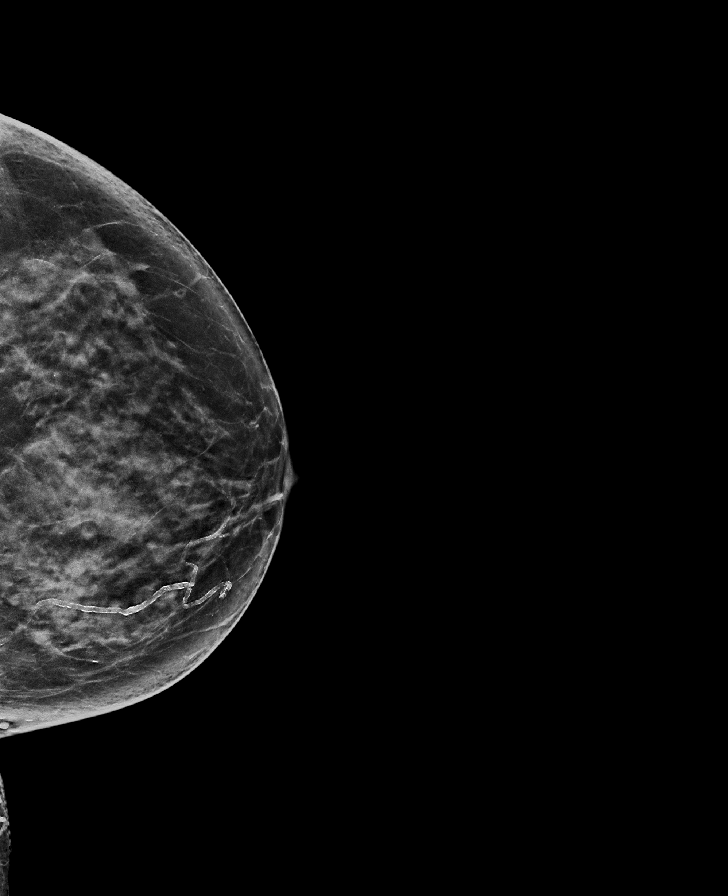

[R CC synth-2D]
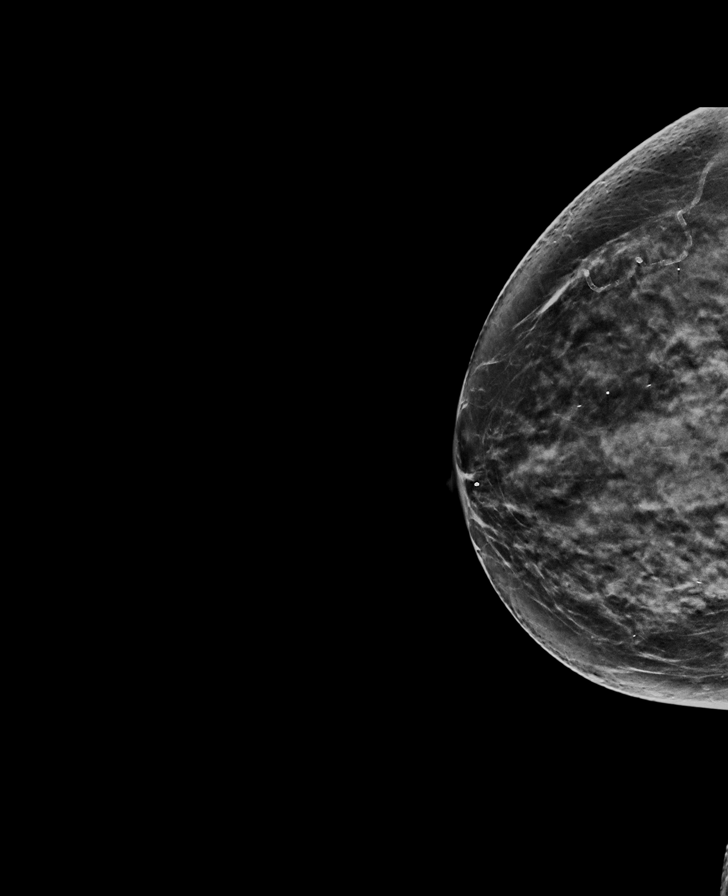

[8 of 28 positions shown; findings below may reference images not displayed]

ACR Breast Density Category c: The breast tissue is heterogeneously
dense, which may obscure small masses.
FINDINGS: The previously questioned asymmetry within the inner right breast is
again less conspicuous on today's exam. There are no new dominant
masses, suspicious calcifications or secondary signs of malignancy
within either breast.

Mammographic images were processed with CAD.
IMPRESSION: Probably benign right breast asymmetry is increasingly less
conspicuous suggesting benignity. Recommend additional follow-up
right breast diagnostic mammogram in 12 months to ensure 2 year
stability.

No evidence of malignancy within the left breast.

RECOMMENDATION:
Bilateral diagnostic mammogram in 12 months.

I have discussed the findings and recommendations with the patient.
Results were also provided in writing at the conclusion of the
visit. If applicable, a reminder letter will be sent to the patient
regarding the next appointment.

BI-RADS CATEGORY  3: Probably benign.

## 2017-08-04 ENCOUNTER — Other Ambulatory Visit: Payer: Self-pay | Admitting: Cardiovascular Disease

## 2017-08-04 MED ORDER — SIMVASTATIN 40 MG PO TABS: 40.0000 mg | ORAL_TABLET | Freq: Every day | ORAL | 2 refills | Status: DC

## 2017-08-04 MED ORDER — LOSARTAN POTASSIUM 25 MG PO TABS: 25.0000 mg | ORAL_TABLET | Freq: Every day | ORAL | 2 refills | Status: DC

## 2017-08-04 NOTE — Telephone Encounter (Signed)
Pt's medication was sent to pt's pharmacy as requested. Confirmation received.  °

## 2017-08-26 DIAGNOSIS — Z6823 Body mass index (BMI) 23.0-23.9, adult: Secondary | ICD-10-CM | POA: Diagnosis not present

## 2017-08-26 DIAGNOSIS — Z01419 Encounter for gynecological examination (general) (routine) without abnormal findings: Secondary | ICD-10-CM | POA: Diagnosis not present

## 2017-08-26 DIAGNOSIS — Z1231 Encounter for screening mammogram for malignant neoplasm of breast: Secondary | ICD-10-CM | POA: Diagnosis not present

## 2017-08-26 DIAGNOSIS — M816 Localized osteoporosis [Lequesne]: Secondary | ICD-10-CM | POA: Diagnosis not present

## 2017-08-26 DIAGNOSIS — N958 Other specified menopausal and perimenopausal disorders: Secondary | ICD-10-CM | POA: Diagnosis not present

## 2017-11-29 NOTE — Progress Notes (Signed)
Cardiology Office Note:    Date:  11/30/2017   ID:  Misty Morales, DOB 12-Jun-1945, MRN [id number]  PCP:  Misty Cruel, MD  Cardiologist:  Misty Moores, MD / Misty Dopp, PA-C  Electrophysiologist:  None   Referring MD: Misty Cruel, MD   Chief Complaint  Patient presents with  . Follow-up    HTN, HLP    History of Present Illness:    Misty Morales is a 72 y.o. female with hypertension, hyperlipidemia.  She plays competitive tennis. In 12/15, at a neighborhood Christmas party, she had an episode of syncope. A 30 day event monitor demonstrated no significant arrhythmia. Her echocardiogram was normal. She was last seen 05/2017.  She was doing well without any problems. But, her her ECG was markedly abnormal.  An echocardiogram demonstrated normal ejection fraction.  A Nuclear stress test demonstrated no significant ischemia and was low risk.    Misty Morales returns for follow-up.  She is here alone.  She denies chest pain, shortness of breath, syncope, orthopnea, PND or significant pedal edema.  Prior CV studies:   The following studies were reviewed today:  Nuclear stress test 06/11/17 Medium size, moderate intensity mostly fixed inferoseptal, anteroseptal and septal perfusion defect (SDS 3). This not felt to represent significant ischemia ("c/w artifact"). LVEF 61% with normal wall motion. Overall a low risk study.  Echo 06/11/17 Mild LVH, EF 55-60, no RWMA, Gr 1 DD, mild MR, mild LAE  Event Monitor 12/15 No sig arrhythmias  Echo 12/15 EF 80-99%, grade 1 diastolic dysfunction, PASP 32 mmHg  Past Medical History:  Diagnosis Date  . Breast swelling    right breast swelling and pain  . Chills   . Clostridium difficile colitis   . Diarrhea   . Fever   . Fracture 05/14/2007   Malunion, fracture, right ulna  . History of echocardiogram    Echo 4/19: mild LVH, EF 55-60, no RWMA, Gr 1 DD, mild MR, mild LAE  . History of nuclear stress test    Nuclear stress  test 4/19: Medium size, moderate intensity mostly fixed inferoseptal, anteroseptal and septal perfusion defect (SDS 3). This not felt to represent significant ischemia. LVEF 61% with normal wall motion. Overall a low risk study.   . Hypercholesterolemia   . Hypertension   . Leukocytosis    probably secondary to #1, improved, needs outpatient follow-up.  . Mastitis    Right breast mastitis with septicemia, resolving.  Patient clinically stable on discharge  . Osteoporosis    Surgical Hx: The patient  has a past surgical history that includes Rotator cuff repair (unknown) and arm surg (arm surgery 2010).   Current Medications: Current Meds  Medication Sig  . aspirin EC 81 MG tablet Take 1 tablet (81 mg total) by mouth daily.  . calcium-vitamin D (OSCAL WITH D) 500-200 MG-UNIT per tablet Take 1 tablet by mouth daily. Taking 750 bid  . Cholecalciferol (VITAMIN D PO) Take 1,000 Int'l Units by mouth daily.    . Glucosamine-Chondroitin (GLUCOSAMINE CHONDR COMPLEX PO) TAKE AS DIRECTED BY MOUTH DAILY  . GRAPE SEED EXTRACT PO Take 1 tablet by mouth daily.   Marland Kitchen losartan (COZAAR) 25 MG tablet Take 1 tablet (25 mg total) by mouth daily.  . Multiple Vitamin (MULTIVITAMIN) tablet Take 1 tablet by mouth daily.    . Omega-3 Fatty Acids (FISH OIL) 1000 MG CAPS Take by mouth as directed.  . simvastatin (ZOCOR) 40 MG tablet Take 1 tablet (40 mg  total) by mouth at bedtime.  . vitamin E 100 UNIT capsule Take 100 Units by mouth daily. Unsure of units     Allergies:   Adhesive [tape]   Social History   Tobacco Use  . Smoking status: Never Smoker  . Smokeless tobacco: Never Used  Substance Use Topics  . Alcohol use: Yes  . Drug use: Not on file     Family Hx: The patient's family history includes Diabetes in her mother; Heart disease in her father and mother.  ROS:   Please see the history of present illness.    ROS All other systems reviewed and are negative.   EKGs/Labs/Other Test Reviewed:      EKG:  EKG is not ordered today.    Recent Labs: 05/29/2017: ALT 20; BUN 17; Creatinine, Ser 0.71; Magnesium 2.3; Potassium 4.6; Sodium 141; TSH 1.330   Recent Lipid Panel Lab Results  Component Value Date/Time   CHOL 183 05/29/2017 09:19 AM   TRIG 90 05/29/2017 09:19 AM   HDL 55 05/29/2017 09:19 AM   CHOLHDL 3.3 05/29/2017 09:19 AM   CHOLHDL 2.6 06/04/2015 10:47 AM   LDLCALC 110 (H) 05/29/2017 09:19 AM    Physical Exam:    VS:  BP (!) 150/82   Pulse 76   Ht 5' 3.5" (1.613 m)   Wt 138 lb (62.6 kg)   SpO2 95%   BMI 24.06 kg/m     Wt Readings from Last 3 Encounters:  11/30/17 138 lb (62.6 kg)  06/11/17 136 lb (61.7 kg)  05/29/17 136 lb (61.7 kg)     Physical Exam  Constitutional: She is oriented to person, place, and time. She appears well-developed and well-nourished. No distress.  HENT:  Head: Normocephalic and atraumatic.  Eyes: No scleral icterus.  Neck: No JVD present.  Cardiovascular: Normal rate and regular rhythm.  No murmur heard. Pulmonary/Chest: Effort normal. She has no rales.  Abdominal: Soft.  Musculoskeletal: She exhibits no edema.  Neurological: She is alert and oriented to person, place, and time.  Skin: Skin is warm and dry.  Psychiatric: She has a normal mood and affect.    ASSESSMENT & PLAN:    Essential hypertension Blood pressure somewhat elevated today.  She did have a high salt meal yesterday.  Blood pressures at home have usually been optimal.  I have asked her to continue to monitor this.  Stress test earlier this year was low risk.  Echocardiogram demonstrated normal LV function.  Continue current medications.  Hyperlipidemia, unspecified hyperlipidemia type   LDL optimal on most recent lab work.  Continue current Rx.     Dispo:  Return in about 1 year (around 12/01/2018) for Routine Follow Up, w/ Misty Dopp, PA-C.   Medication Adjustments/Labs and Tests Ordered: Current medicines are reviewed at length with the patient today.   Concerns regarding medicines are outlined above.  Tests Ordered: No orders of the defined types were placed in this encounter.  Medication Changes: No orders of the defined types were placed in this encounter.   Signed, Misty Dopp, PA-C  11/30/2017 9:45 AM    [facility] [place], [place], [place]  [address] Phone: [phone]; Fax: [phone]

## 2017-11-30 ENCOUNTER — Encounter: Payer: Self-pay | Admitting: Physician Assistant

## 2017-11-30 ENCOUNTER — Ambulatory Visit (INDEPENDENT_AMBULATORY_CARE_PROVIDER_SITE_OTHER): Payer: Medicare Other | Admitting: Physician Assistant

## 2017-11-30 VITALS — BP 150/82 | HR 76 | Ht 63.5 in | Wt 138.0 lb

## 2017-11-30 DIAGNOSIS — E785 Hyperlipidemia, unspecified: Secondary | ICD-10-CM | POA: Diagnosis not present

## 2017-11-30 DIAGNOSIS — I10 Essential (primary) hypertension: Secondary | ICD-10-CM

## 2017-11-30 NOTE — Patient Instructions (Signed)
Medication Instructions:  Your physician recommends that you continue on your current medications as directed. Please refer to the Current Medication list given to you today.  If you need a refill on your cardiac medications before your next appointment, please call your pharmacy.   Lab work: NONE ORDERED TODAY If you have labs (blood work) drawn today and your tests are completely normal, you will receive your results only by: Marland Kitchen MyChart Message (if you have MyChart) OR . A paper copy in the mail If you have any lab test that is abnormal or we need to change your treatment, we will call you to review the results.  Testing/Procedures: NONE ORDERED   Follow-Up: At [facility], you and your health needs are our priority.  As part of our continuing mission to provide you with exceptional heart care, we have created designated Provider Care Teams.  These Care Teams include your primary Cardiologist (physician) and Advanced Practice Providers (APPs -  Physician Assistants and Nurse Practitioners) who all work together to provide you with the care you need, when you need it. Marland Kitchen SCOTT WEAVER, PAC   Any Other Special Instructions Will Be Listed Below (If Applicable). NONE

## 2017-12-15 DIAGNOSIS — H25013 Cortical age-related cataract, bilateral: Secondary | ICD-10-CM | POA: Diagnosis not present

## 2017-12-15 DIAGNOSIS — H524 Presbyopia: Secondary | ICD-10-CM | POA: Diagnosis not present

## 2017-12-15 DIAGNOSIS — H52223 Regular astigmatism, bilateral: Secondary | ICD-10-CM | POA: Diagnosis not present

## 2017-12-15 DIAGNOSIS — H25033 Anterior subcapsular polar age-related cataract, bilateral: Secondary | ICD-10-CM | POA: Diagnosis not present

## 2017-12-15 DIAGNOSIS — H5213 Myopia, bilateral: Secondary | ICD-10-CM | POA: Diagnosis not present

## 2017-12-15 DIAGNOSIS — H2513 Age-related nuclear cataract, bilateral: Secondary | ICD-10-CM | POA: Diagnosis not present

## 2017-12-24 DIAGNOSIS — Z23 Encounter for immunization: Secondary | ICD-10-CM | POA: Diagnosis not present

## 2017-12-24 DIAGNOSIS — M810 Age-related osteoporosis without current pathological fracture: Secondary | ICD-10-CM | POA: Diagnosis not present

## 2017-12-24 DIAGNOSIS — Z0000 Encounter for general adult medical examination without abnormal findings: Secondary | ICD-10-CM | POA: Diagnosis not present

## 2018-02-15 DIAGNOSIS — H00015 Hordeolum externum left lower eyelid: Secondary | ICD-10-CM | POA: Diagnosis not present

## 2018-04-04 ENCOUNTER — Other Ambulatory Visit: Payer: Self-pay | Admitting: Cardiovascular Disease

## 2018-05-06 DIAGNOSIS — L819 Disorder of pigmentation, unspecified: Secondary | ICD-10-CM | POA: Diagnosis not present

## 2018-05-06 DIAGNOSIS — L814 Other melanin hyperpigmentation: Secondary | ICD-10-CM | POA: Diagnosis not present

## 2018-05-06 DIAGNOSIS — D1801 Hemangioma of skin and subcutaneous tissue: Secondary | ICD-10-CM | POA: Diagnosis not present

## 2018-05-06 DIAGNOSIS — B079 Viral wart, unspecified: Secondary | ICD-10-CM | POA: Diagnosis not present

## 2018-05-06 DIAGNOSIS — C44529 Squamous cell carcinoma of skin of other part of trunk: Secondary | ICD-10-CM | POA: Diagnosis not present

## 2018-05-06 DIAGNOSIS — D229 Melanocytic nevi, unspecified: Secondary | ICD-10-CM | POA: Diagnosis not present

## 2018-05-06 DIAGNOSIS — L570 Actinic keratosis: Secondary | ICD-10-CM | POA: Diagnosis not present

## 2018-05-06 DIAGNOSIS — D485 Neoplasm of uncertain behavior of skin: Secondary | ICD-10-CM | POA: Diagnosis not present

## 2018-05-06 DIAGNOSIS — L821 Other seborrheic keratosis: Secondary | ICD-10-CM | POA: Diagnosis not present

## 2018-10-21 DIAGNOSIS — L819 Disorder of pigmentation, unspecified: Secondary | ICD-10-CM | POA: Diagnosis not present

## 2018-10-28 DIAGNOSIS — Z01419 Encounter for gynecological examination (general) (routine) without abnormal findings: Secondary | ICD-10-CM | POA: Diagnosis not present

## 2018-10-28 DIAGNOSIS — I10 Essential (primary) hypertension: Secondary | ICD-10-CM | POA: Insufficient documentation

## 2018-10-28 DIAGNOSIS — Z1231 Encounter for screening mammogram for malignant neoplasm of breast: Secondary | ICD-10-CM | POA: Diagnosis not present

## 2018-10-28 DIAGNOSIS — Z6825 Body mass index (BMI) 25.0-25.9, adult: Secondary | ICD-10-CM | POA: Diagnosis not present

## 2018-11-16 DIAGNOSIS — Z1211 Encounter for screening for malignant neoplasm of colon: Secondary | ICD-10-CM | POA: Diagnosis not present

## 2018-11-29 NOTE — Progress Notes (Signed)
Cardiology Office Note:    Date:  11/30/2018   ID:  Misty Morales, DOB October 14, 1945, MRN [id number]  PCP:  Lawerance Cruel, MD  Cardiologist:  Mertie Moores, MD / Richardson Dopp, PA-C  Electrophysiologist:  None   Referring MD: Lawerance Cruel, MD   Chief Complaint  Patient presents with  . Follow-up    Hypertension    History of Present Illness:    Misty Morales is a 73 y.o. female with:   Hypertension   Hyperlipidemia   Hx of syncope 01/2014  Event monitor, echocardiogram normal  Abnormal ECG  Myoview 4/19: no significant ischemia; low risk  Echocardiogram 4/19: EF 55-60, Gr 1 DD  Misty Morales was last seen in 11/2017.    She returns for follow-up.  She is here alone.  She continues to play tennis on a regular basis.  She has not had chest discomfort, shortness of breath, syncope, orthopnea or lower extremity swelling.   Prior CV studies:   The following studies were reviewed today:   Nuclear stress test 06/11/17 Medium size, moderate intensity mostly fixed inferoseptal, anteroseptal and septal perfusion defect (SDS 3). This not felt to represent significant ischemia ("c/w artifact"). LVEF 61% with normal wall motion. Overall a low risk study.  Echo 06/11/17 Mild LVH, EF 55-60, no RWMA, Gr 1 DD, mild MR, mild LAE  Event Monitor 12/15 No sig arrhythmias  Echo 12/15 EF 0000000, grade 1 diastolic dysfunction, PASP 32 mmHg  Past Medical History:  Diagnosis Date  . Breast swelling    right breast swelling and pain  . Chills   . Clostridium difficile colitis   . Diarrhea   . Fever   . Fracture 05/14/2007   Malunion, fracture, right ulna  . History of echocardiogram    Echo 4/19: mild LVH, EF 55-60, no RWMA, Gr 1 DD, mild MR, mild LAE  . History of nuclear stress test    Nuclear stress test 4/19: Medium size, moderate intensity mostly fixed inferoseptal, anteroseptal and septal perfusion defect (SDS 3). This not felt to represent significant  ischemia. LVEF 61% with normal wall motion. Overall a low risk study.   . Hypercholesterolemia   . Hypertension   . Leukocytosis    probably secondary to #1, improved, needs outpatient follow-up.  . Mastitis    Right breast mastitis with septicemia, resolving.  Patient clinically stable on discharge  . Osteoporosis    Surgical Hx: The patient  has a past surgical history that includes Rotator cuff repair (unknown) and arm surg (arm surgery 2010).   Current Medications: Current Meds  Medication Sig  . Ascorbic Acid (VITAMIN C) 1000 MG tablet Take by mouth.  Marland Kitchen aspirin EC 81 MG tablet Take 1 tablet (81 mg total) by mouth daily.  . calcium-vitamin D (OSCAL WITH D) 500-200 MG-UNIT per tablet Take 1 tablet by mouth daily. Taking 750 bid  . Cholecalciferol (VITAMIN D PO) Take 1,000 Int'l Units by mouth daily.    . Cyanocobalamin (VITAMIN B-12 PO) Take by mouth.  . Ferrous Sulfate (IRON PO) Take by mouth. 3 times weekly  . Glucosamine-Chondroitin (GLUCOSAMINE CHONDR COMPLEX PO) TAKE AS DIRECTED BY MOUTH DAILY  . GRAPE SEED EXTRACT PO Take 1 tablet by mouth daily.   Marland Kitchen losartan (COZAAR) 25 MG tablet TAKE 1 TABLET BY MOUTH  DAILY  . Multiple Vitamin (MULTIVITAMIN) tablet Take 1 tablet by mouth daily.    . Omega-3 Fatty Acids (FISH OIL) 1000 MG CAPS Take by mouth as  directed.  . simvastatin (ZOCOR) 40 MG tablet TAKE 1 TABLET BY MOUTH AT  BEDTIME  . VITAMIN A PO Take by mouth.  . vitamin E 100 UNIT capsule Take 100 Units by mouth daily. Unsure of units     Allergies:   Adhesive [tape]   Social History   Tobacco Use  . Smoking status: Never Smoker  . Smokeless tobacco: Never Used  Substance Use Topics  . Alcohol use: Yes  . Drug use: Not on file     Family Hx: The patient's family history includes Diabetes in her mother; Heart disease in her father and mother.  ROS:   Please see the history of present illness.    ROS All other systems reviewed and are negative.   EKGs/Labs/Other  Test Reviewed:    EKG:  EKG is  ordered today.  The ekg ordered today demonstrates normal sinus rhythm, 89, normal axis, no ST-T wave changes, QTC 447  Recent Labs: No results found for requested labs within last 8760 hours.   Recent Lipid Panel Lab Results  Component Value Date/Time   CHOL 183 05/29/2017 09:19 AM   TRIG 90 05/29/2017 09:19 AM   HDL 55 05/29/2017 09:19 AM   CHOLHDL 3.3 05/29/2017 09:19 AM   CHOLHDL 2.6 06/04/2015 10:47 AM   LDLCALC 110 (H) 05/29/2017 09:19 AM    Physical Exam:    VS:  BP 120/72   Pulse 88   Ht 5' 3.5" (1.613 m)   Wt 136 lb 9.6 oz (62 kg)   SpO2 95%   BMI 23.82 kg/m     Wt Readings from Last 3 Encounters:  11/30/18 136 lb 9.6 oz (62 kg)  11/30/17 138 lb (62.6 kg)  06/11/17 136 lb (61.7 kg)     Physical Exam  Constitutional: She is oriented to person, place, and time. She appears well-developed and well-nourished. No distress.  HENT:  Head: Normocephalic and atraumatic.  Neck: Neck supple. No JVD present. Carotid bruit is not present.  Cardiovascular: Normal rate, regular rhythm, S1 normal, S2 normal and normal heart sounds.  No murmur heard. Pulmonary/Chest: Effort normal. She has no rales.  Abdominal: Soft. There is no hepatomegaly.  Musculoskeletal:        General: No edema.  Neurological: She is alert and oriented to person, place, and time.  Skin: Skin is warm and dry.    ASSESSMENT & PLAN:    1. Essential hypertension The patient's blood pressure is controlled on her current regimen.  Continue current therapy.   2. Hyperlipidemia, unspecified hyperlipidemia type LDL optimal on most recent lab work.  Continue current Rx.     Dispo:  Return in about 1 year (around 11/30/2019) for Routine Follow Up, w/ Richardson Dopp, PA-C, (virtual or in-person).   Medication Adjustments/Labs and Tests Ordered: Current medicines are reviewed at length with the patient today.  Concerns regarding medicines are outlined above.  Tests  Ordered: No orders of the defined types were placed in this encounter.  Medication Changes: No orders of the defined types were placed in this encounter.   Signed, Richardson Dopp, PA-C  11/30/2018 10:50 AM    [facility] [place], [place], [place]  [address] Phone: [phone]; Fax: [phone]

## 2018-11-30 ENCOUNTER — Ambulatory Visit (INDEPENDENT_AMBULATORY_CARE_PROVIDER_SITE_OTHER): Payer: Medicare Other | Admitting: Physician Assistant

## 2018-11-30 ENCOUNTER — Other Ambulatory Visit: Payer: Self-pay

## 2018-11-30 ENCOUNTER — Encounter: Payer: Self-pay | Admitting: Physician Assistant

## 2018-11-30 VITALS — BP 120/72 | HR 88 | Ht 63.5 in | Wt 136.6 lb

## 2018-11-30 DIAGNOSIS — I10 Essential (primary) hypertension: Secondary | ICD-10-CM

## 2018-11-30 DIAGNOSIS — E785 Hyperlipidemia, unspecified: Secondary | ICD-10-CM

## 2018-11-30 NOTE — Patient Instructions (Signed)
Medication Instructions:  Your physician recommends that you continue on your current medications as directed. Please refer to the Current Medication list given to you today.  If you need a refill on your cardiac medications before your next appointment, please call your pharmacy.   Lab work: None  If you have labs (blood work) drawn today and your tests are completely normal, you will receive your results only by: Marland Kitchen MyChart Message (if you have MyChart) OR . A paper copy in the mail If you have any lab test that is abnormal or we need to change your treatment, we will call you to review the results.  Testing/Procedures: None   Follow-Up: You are scheduled to see Richardson Dopp PA-C on 12/02/2019 @ 10:15 AM  Any Other Special Instructions Will Be Listed Below (If Applicable).

## 2018-12-23 DIAGNOSIS — Z23 Encounter for immunization: Secondary | ICD-10-CM | POA: Diagnosis not present

## 2019-03-15 ENCOUNTER — Other Ambulatory Visit: Payer: Self-pay | Admitting: Cardiovascular Disease

## 2019-11-16 DIAGNOSIS — D229 Melanocytic nevi, unspecified: Secondary | ICD-10-CM | POA: Diagnosis not present

## 2019-11-16 DIAGNOSIS — L814 Other melanin hyperpigmentation: Secondary | ICD-10-CM | POA: Diagnosis not present

## 2019-11-16 DIAGNOSIS — L821 Other seborrheic keratosis: Secondary | ICD-10-CM | POA: Diagnosis not present

## 2019-11-16 DIAGNOSIS — L570 Actinic keratosis: Secondary | ICD-10-CM | POA: Diagnosis not present

## 2019-11-17 DIAGNOSIS — Z6824 Body mass index (BMI) 24.0-24.9, adult: Secondary | ICD-10-CM | POA: Diagnosis not present

## 2019-11-17 DIAGNOSIS — Z124 Encounter for screening for malignant neoplasm of cervix: Secondary | ICD-10-CM | POA: Diagnosis not present

## 2019-11-30 DIAGNOSIS — Z0000 Encounter for general adult medical examination without abnormal findings: Secondary | ICD-10-CM | POA: Diagnosis not present

## 2019-12-02 ENCOUNTER — Ambulatory Visit: Payer: Medicare Other | Admitting: Physician Assistant

## 2019-12-15 DIAGNOSIS — N958 Other specified menopausal and perimenopausal disorders: Secondary | ICD-10-CM | POA: Diagnosis not present

## 2019-12-15 DIAGNOSIS — Z1231 Encounter for screening mammogram for malignant neoplasm of breast: Secondary | ICD-10-CM | POA: Diagnosis not present

## 2019-12-15 DIAGNOSIS — M816 Localized osteoporosis [Lequesne]: Secondary | ICD-10-CM | POA: Diagnosis not present

## 2019-12-15 NOTE — Progress Notes (Signed)
Cardiology Office Note:    Date:  12/16/2019   ID:  REGINAE WOLFREY, DOB August 24, 1945, MRN [id number]  PCP:  Lawerance Cruel, MD  [facility] Cardiologist:  Mertie Moores, MD / Richardson Dopp, PA-C  [facility] Electrophysiologist:  None   Referring MD: Lawerance Cruel, MD   Chief Complaint:  Follow-up (HTN, HLP)    Patient Profile:    Misty Morales is a 74 y.o. female with:   Hypertension   Hyperlipidemia   Hx of syncope 01/2014  Event monitor, echocardiogram normal  Abnormal ECG  Myoview 4/19: no significant ischemia; low risk  Echocardiogram 4/19: EF 55-60, Gr 1 DD  Prior CV studies: Nuclear stress test4/25/19 Medium size, moderate intensity mostly fixed inferoseptal, anteroseptal and septal perfusion defect (SDS 3). This not felt to represent significant ischemia("c/w artifact"). LVEF 61% with normal wall motion. Overall a low risk study.  Echo 06/11/17 Mild LVH, EF 55-60, no RWMA, Gr 1 DD, mild MR, mild LAE  Event Monitor 12/15 No sig arrhythmias  Echo 12/15 EF 78-46%, grade 1 diastolic dysfunction, PASP 32 mmHg   History of Present Illness:    Misty Morales was last seen in 11/2018.  She returns for follow up.  She is here alone. She is doing well.  She still plays tennis 1-2 times a week.  She also has a new dog and she walks the dog with her husband.  She has no chest pain, shortness of breath, syncope, leg swelling.        Past Medical History:  Diagnosis Date  . Breast swelling    right breast swelling and pain  . Chills   . Clostridium difficile colitis   . Diarrhea   . Fever   . Fracture 05/14/2007   Malunion, fracture, right ulna  . History of echocardiogram    Echo 4/19: mild LVH, EF 55-60, no RWMA, Gr 1 DD, mild MR, mild LAE  . History of nuclear stress test    Nuclear stress test 4/19: Medium size, moderate intensity mostly fixed inferoseptal, anteroseptal and septal perfusion defect (SDS 3). This not felt to represent  significant ischemia. LVEF 61% with normal wall motion. Overall a low risk study.   . Hypercholesterolemia   . Hypertension   . Leukocytosis    probably secondary to #1, improved, needs outpatient follow-up.  . Mastitis    Right breast mastitis with septicemia, resolving.  Patient clinically stable on discharge  . Osteoporosis     Current Medications: Current Meds  Medication Sig  . Ascorbic Acid (VITAMIN C) 1000 MG tablet Take by mouth.  . calcium-vitamin D (OSCAL WITH D) 500-200 MG-UNIT per tablet Take 1 tablet by mouth daily. Taking 750 bid  . Cholecalciferol (VITAMIN D PO) Take 1,000 Int'l Units by mouth daily.    . Cyanocobalamin (VITAMIN B-12 PO) Take by mouth.  . Ferrous Sulfate (IRON PO) Take by mouth. 3 times weekly  . losartan (COZAAR) 25 MG tablet TAKE 1 TABLET BY MOUTH  DAILY  . misoprostol (CYTOTEC) 200 MCG tablet as directed. For procedure  . Multiple Vitamin (MULTIVITAMIN) tablet Take 1 tablet by mouth daily.    . Omega-3 Fatty Acids (FISH OIL) 1000 MG CAPS Take by mouth daily.   . simvastatin (ZOCOR) 40 MG tablet TAKE 1 TABLET BY MOUTH AT  BEDTIME  . VITAMIN A PO Take by mouth daily.   . vitamin E 100 UNIT capsule Take 100 Units by mouth daily. Unsure of units  . [DISCONTINUED]  aspirin EC 81 MG tablet Take 1 tablet (81 mg total) by mouth daily.     Allergies:   Adhesive [tape]   Social History   Tobacco Use  . Smoking status: Never Smoker  . Smokeless tobacco: Never Used  Substance Use Topics  . Alcohol use: Yes  . Drug use: Not on file     Family Hx: The patient's family history includes Diabetes in her mother; Heart disease in her father and mother.  ROS   EKGs/Labs/Other Test Reviewed:    EKG:  EKG is  ordered today.  The ekg ordered today demonstrates normal sinus rhythm, HR 77, leftward axis, no ST-TW changes, QTc 445, no changes.  Recent Labs: No results found for requested labs within last 8760 hours.   Recent Lipid Panel Lab Results    Component Value Date/Time   CHOL 183 05/29/2017 09:19 AM   TRIG 90 05/29/2017 09:19 AM   HDL 55 05/29/2017 09:19 AM   CHOLHDL 3.3 05/29/2017 09:19 AM   CHOLHDL 2.6 06/04/2015 10:47 AM   LDLCALC 110 (H) 05/29/2017 09:19 AM      Risk Assessment/Calculations:      Physical Exam:    VS:  BP 120/72   Pulse 77   Ht 5' 3.5" (1.613 m)   Wt 136 lb (61.7 kg)   SpO2 96%   BMI 23.71 kg/m     Wt Readings from Last 3 Encounters:  12/16/19 136 lb (61.7 kg)  11/30/18 136 lb 9.6 oz (62 kg)  11/30/17 138 lb (62.6 kg)     Constitutional:      Appearance: Healthy appearance. Not in distress.  Neck:     Thyroid: No thyromegaly.     Vascular: No carotid bruit. JVD normal.  Pulmonary:     Effort: Pulmonary effort is normal.     Breath sounds: No wheezing. No rales.  Cardiovascular:     Normal rate. Regular rhythm. Normal S1. Normal S2.     Murmurs: There is no murmur.  Edema:    Peripheral edema absent.  Abdominal:     Palpations: Abdomen is soft. There is no hepatomegaly.  Skin:    General: Skin is warm and dry.  Neurological:     Mental Status: Alert and oriented to person, place and time.     Cranial Nerves: Cranial nerves are intact.       ASSESSMENT & PLAN:    1. Essential hypertension The patient's blood pressure is controlled on her current regimen.  Continue current therapy.    2. Hypercholesterolemia Continue simvastatin.  She is fasting.  Obtain CMET, Lipids today.  We discussed the ASPREE trial.  She does not need to remain on ASA for primary prevention.  She can DC ASA.    3. Need for immunization against influenza Influenza vaccine given today.       Dispo:  Return in about 1 year (around 12/15/2020) for Routine Follow Up, w/ Richardson Dopp, PA-C, in person.   Medication Adjustments/Labs and Tests Ordered: Current medicines are reviewed at length with the patient today.  Concerns regarding medicines are outlined above.  Tests Ordered: Orders Placed This  Encounter  Procedures  . Flu Vaccine QUAD High Dose(Fluad)  . Comprehensive metabolic panel  . Lipid panel  . EKG 12-Lead   Medication Changes: No orders of the defined types were placed in this encounter.   Signed, Richardson Dopp, PA-C  12/16/2019 11:02 AM    [place],  [place], [place]  [address] Phone: [phone]; Fax: [phone]

## 2019-12-16 ENCOUNTER — Ambulatory Visit (INDEPENDENT_AMBULATORY_CARE_PROVIDER_SITE_OTHER): Payer: Medicare Other | Admitting: Physician Assistant

## 2019-12-16 ENCOUNTER — Other Ambulatory Visit: Payer: Self-pay

## 2019-12-16 ENCOUNTER — Encounter: Payer: Self-pay | Admitting: Physician Assistant

## 2019-12-16 VITALS — BP 120/72 | HR 77 | Ht 63.5 in | Wt 136.0 lb

## 2019-12-16 DIAGNOSIS — Z23 Encounter for immunization: Secondary | ICD-10-CM | POA: Diagnosis not present

## 2019-12-16 DIAGNOSIS — E7800 Pure hypercholesterolemia, unspecified: Secondary | ICD-10-CM | POA: Diagnosis not present

## 2019-12-16 DIAGNOSIS — I10 Essential (primary) hypertension: Secondary | ICD-10-CM

## 2019-12-16 LAB — LIPID PANEL
Chol/HDL Ratio: 2.8 ratio (ref 0.0–4.4)
Cholesterol, Total: 165 mg/dL (ref 100–199)
HDL: 60 mg/dL (ref >39)
LDL Chol Calc (NIH): 84 mg/dL (ref 0–99)
Triglycerides: 122 mg/dL (ref 0–149)
VLDL Cholesterol Cal: 21 mg/dL (ref 5–40)

## 2019-12-16 LAB — COMPREHENSIVE METABOLIC PANEL
ALT: 24 IU/L (ref 0–32)
AST: 27 IU/L (ref 0–40)
Albumin/Globulin Ratio: 2.2 (ref 1.2–2.2)
Albumin: 4.9 g/dL — ABNORMAL HIGH (ref 3.7–4.7)
Alkaline Phosphatase: 85 IU/L (ref 44–121)
BUN/Creatinine Ratio: 16 (ref 12–28)
BUN: 13 mg/dL (ref 8–27)
Bilirubin Total: 0.4 mg/dL (ref 0.0–1.2)
CO2: 26 mmol/L (ref 20–29)
Calcium: 9.7 mg/dL (ref 8.7–10.3)
Chloride: 101 mmol/L (ref 96–106)
Creatinine, Ser: 0.83 mg/dL (ref 0.57–1.00)
GFR calc Af Amer: 79 mL/min/{1.73_m2} (ref >59)
GFR calc non Af Amer: 69 mL/min/{1.73_m2} (ref >59)
Globulin, Total: 2.2 g/dL (ref 1.5–4.5)
Glucose: 90 mg/dL (ref 65–99)
Potassium: 4.1 mmol/L (ref 3.5–5.2)
Sodium: 140 mmol/L (ref 134–144)
Total Protein: 7.1 g/dL (ref 6.0–8.5)

## 2019-12-16 NOTE — Patient Instructions (Signed)
Medication Instructions:  Your physician recommends that you continue on your current medications as directed. Please refer to the Current Medication list given to you today.  *If you need a refill on your cardiac medications before your next appointment, please call your pharmacy*  Lab Work: You will have labs drawn today: CMET/Lipids  If you have labs (blood work) drawn today and your tests are completely normal, you will receive your results only by: [place] MyChart Message (if you have MyChart) OR . A paper copy in the mail If you have any lab test that is abnormal or we need to change your treatment, we will call you to review the results.  Testing/Procedures: None ordered today  Follow-Up: At [facility], you and your health needs are our priority.  As part of our continuing mission to provide you with exceptional heart care, we have created designated Provider Care Teams.  These Care Teams include your primary Cardiologist (physician) and Advanced Practice Providers (APPs -  Physician Assistants and Nurse Practitioners) who all work together to provide you with the care you need, when you need it.  Your next appointment:   12 month(s)  The format for your next appointment:   In Person  Provider:   Richardson Dopp, PA-C

## 2019-12-22 DIAGNOSIS — N858 Other specified noninflammatory disorders of uterus: Secondary | ICD-10-CM | POA: Diagnosis not present

## 2019-12-22 DIAGNOSIS — R87619 Unspecified abnormal cytological findings in specimens from cervix uteri: Secondary | ICD-10-CM | POA: Diagnosis not present

## 2020-03-27 DIAGNOSIS — H2513 Age-related nuclear cataract, bilateral: Secondary | ICD-10-CM | POA: Diagnosis not present

## 2020-03-27 DIAGNOSIS — H52223 Regular astigmatism, bilateral: Secondary | ICD-10-CM | POA: Diagnosis not present

## 2020-03-27 DIAGNOSIS — H5213 Myopia, bilateral: Secondary | ICD-10-CM | POA: Diagnosis not present

## 2020-03-27 DIAGNOSIS — H25033 Anterior subcapsular polar age-related cataract, bilateral: Secondary | ICD-10-CM | POA: Diagnosis not present

## 2020-03-27 DIAGNOSIS — H524 Presbyopia: Secondary | ICD-10-CM | POA: Diagnosis not present

## 2020-03-27 DIAGNOSIS — H25013 Cortical age-related cataract, bilateral: Secondary | ICD-10-CM | POA: Diagnosis not present

## 2020-03-28 ENCOUNTER — Other Ambulatory Visit: Payer: Self-pay | Admitting: Cardiovascular Disease

## 2020-04-30 ENCOUNTER — Other Ambulatory Visit: Payer: Self-pay | Admitting: Cardiovascular Disease

## 2020-08-29 DIAGNOSIS — B0089 Other herpesviral infection: Secondary | ICD-10-CM | POA: Diagnosis not present

## 2020-08-29 DIAGNOSIS — D229 Melanocytic nevi, unspecified: Secondary | ICD-10-CM | POA: Diagnosis not present

## 2020-08-29 DIAGNOSIS — L814 Other melanin hyperpigmentation: Secondary | ICD-10-CM | POA: Diagnosis not present

## 2020-08-29 DIAGNOSIS — L218 Other seborrheic dermatitis: Secondary | ICD-10-CM | POA: Diagnosis not present

## 2020-08-29 DIAGNOSIS — L718 Other rosacea: Secondary | ICD-10-CM | POA: Diagnosis not present

## 2020-08-29 DIAGNOSIS — L821 Other seborrheic keratosis: Secondary | ICD-10-CM | POA: Diagnosis not present

## 2020-08-29 DIAGNOSIS — L819 Disorder of pigmentation, unspecified: Secondary | ICD-10-CM | POA: Diagnosis not present

## 2020-11-15 ENCOUNTER — Other Ambulatory Visit: Payer: Self-pay | Admitting: Cardiovascular Disease

## 2020-12-06 DIAGNOSIS — Z0000 Encounter for general adult medical examination without abnormal findings: Secondary | ICD-10-CM | POA: Diagnosis not present

## 2020-12-06 DIAGNOSIS — Z23 Encounter for immunization: Secondary | ICD-10-CM | POA: Diagnosis not present

## 2021-01-14 NOTE — Progress Notes (Signed)
Cardiology Office Note:    Date:  01/15/2021   ID:  Misty Morales, DOB Dec 30, 1945, MRN [id number]  PCP:  Lawerance Cruel, MD   [facility] Cardiologist:  Mertie Moores, MD Cardiology APP:  Sharmon Revere     Referring MD: Lawerance Cruel, MD   Chief Complaint:  F/u for HTN, HLP    Patient Profile:   Misty Morales is a 75 y.o. female with:  Hypertension  BP ? in 12/2020 >> Losartan DCd Hyperlipidemia  Hx of syncope 01/2014 Event monitor, echocardiogram normal Abnormal ECG Myoview 4/19: no significant ischemia; low risk Echocardiogram 4/19: EF 55-60, Gr 1 DD  History of Present Illness: Misty Morales was last seen in 10/21.  She returns for f/u.  She is here alone.  She has lost about 15 lbs with dieting.  Over the last several months, her BP has been running low.  She brings in a list of BPs.  She has had pressures as low as 60 systolic.  She still plays tennis on a regular basis.  She sometimes gets lightheaded during play.  She notes some lightheadedness with standing at times.  Her BP is optimal today and she has not had any medications yet today.  She has not had chest pain, shortness of breath, syncope.        ASSESSMENT & PLAN:   Essential hypertension She recently lost weight and has had lower BPs on medication.  She does not likely require BP medications anymore.  I have asked her to stop her Losartan.  We discussed the appropriate way to monitor BP at home.  She knows to call if her BP increases to > 130/80, consistently.  CMET today.  F/u in 1 year.   Hypercholesterolemia Continue Simvastatin.  Obtain fasting CMET, Lipids today.  F/u in 1 year.         Dispo:  Return in about 1 year (around 01/15/2022) for Routine Follow Up, w/ Richardson Dopp, PA-C.    Prior CV studies: Nuclear stress test 06/11/17 Medium size, moderate intensity mostly fixed inferoseptal, anteroseptal and septal perfusion defect (SDS 3). This not felt to represent  significant ischemia ("c/w artifact"). LVEF 61% with normal wall motion. Overall a low risk study.   Echo 06/11/17 Mild LVH, EF 55-60, no RWMA, Gr 1 DD, mild MR, mild LAE   Event Monitor 12/15 No sig arrhythmias   Echo 12/15 EF 41-74%, grade 1 diastolic dysfunction, PASP 32 mmHg     Past Medical History:  Diagnosis Date   Breast swelling    right breast swelling and pain   Chills    Clostridium difficile colitis    Diarrhea    Fever    Fracture 05/14/2007   Malunion, fracture, right ulna   History of echocardiogram    Echo 4/19: mild LVH, EF 55-60, no RWMA, Gr 1 DD, mild MR, mild LAE   History of nuclear stress test    Nuclear stress test 4/19: Medium size, moderate intensity mostly fixed inferoseptal, anteroseptal and septal perfusion defect (SDS 3). This not felt to represent significant ischemia. LVEF 61% with normal wall motion. Overall a low risk study.    Hypercholesterolemia    Hypertension    Leukocytosis    probably secondary to #1, improved, needs outpatient follow-up.   Mastitis    Right breast mastitis with septicemia, resolving.  Patient clinically stable on discharge   Osteoporosis    Current Medications: Current Meds  Medication Sig  Ascorbic Acid (VITAMIN C) 1000 MG tablet Take by mouth.   calcium-vitamin D (OSCAL WITH D) 500-200 MG-UNIT per tablet Take 1 tablet by mouth daily. Taking 750 bid   Cholecalciferol (VITAMIN D PO) Take 1,000 Int'l Units by mouth daily.     Cyanocobalamin (VITAMIN B-12 PO) Take by mouth.   Ferrous Sulfate (IRON PO) Take by mouth. 3 times weekly   Lysine 1000 MG TABS Take by mouth.   magnesium oxide (MAG-OX) 400 MG tablet Take 400 mg by mouth daily.   misoprostol (CYTOTEC) 200 MCG tablet as directed. For procedure   Multiple Vitamin (MULTIVITAMIN) tablet Take 1 tablet by mouth daily.     Omega-3 Fatty Acids (FISH OIL) 1000 MG CAPS Take by mouth daily.    simvastatin (ZOCOR) 40 MG tablet Take 1 tablet (40 mg total) by mouth at  bedtime. Please keep upcoming appt for future refills Thank you   VITAMIN A PO Take by mouth daily.    vitamin E 100 UNIT capsule Take 100 Units by mouth daily. Unsure of units   [DISCONTINUED] losartan (COZAAR) 25 MG tablet TAKE 1 TABLET BY MOUTH  DAILY    Allergies:   Adhesive [tape]   Social History   Tobacco Use   Smoking status: Never   Smokeless tobacco: Never  Substance Use Topics   Alcohol use: Yes    Family Hx: The patient's family history includes Diabetes in her mother; Heart disease in her father and mother.  ROS See HPI  EKGs/Labs/Other Test Reviewed:    EKG:  EKG is   ordered today.  The ekg ordered today demonstrates normal sinus rhythm, HR 71, normal axis, no ST-TW changes, septal Q waves, QTc 432 ms, no change from prior tracing   Recent Labs: No results found for requested labs within last 8760 hours.   Recent Lipid Panel Lab Results  Component Value Date/Time   CHOL 165 12/16/2019 11:03 AM   TRIG 122 12/16/2019 11:03 AM   HDL 60 12/16/2019 11:03 AM   LDLCALC 84 12/16/2019 11:03 AM     Risk Assessment/Calculations:          Physical Exam:    VS:  BP 126/76   Pulse 71   Ht 5\' 3"  (1.6 m)   Wt 124 lb 6.4 oz (56.4 kg)   SpO2 96%   BMI 22.04 kg/m     Wt Readings from Last 3 Encounters:  01/15/21 124 lb 6.4 oz (56.4 kg)  12/16/19 136 lb (61.7 kg)  11/30/18 136 lb 9.6 oz (62 kg)    Constitutional:      Appearance: Healthy appearance. Not in distress.  Neck:     Vascular: No JVR. JVD normal.  Pulmonary:     Effort: Pulmonary effort is normal.     Breath sounds: No wheezing. No rales.  Cardiovascular:     Normal rate. Regular rhythm. Normal S1. Normal S2.      Murmurs: There is no murmur.  Edema:    Peripheral edema absent.  Abdominal:     Palpations: Abdomen is soft.  Skin:    General: Skin is warm and dry.  Neurological:     Mental Status: Alert and oriented to person, place and time.     Cranial Nerves: Cranial nerves are intact.         Medication Adjustments/Labs and Tests Ordered: Current medicines are reviewed at length with the patient today.  Concerns regarding medicines are outlined above.  Tests Ordered: Orders Placed This Encounter  Procedures   Comprehensive metabolic panel   Lipid panel   EKG 12-Lead   Medication Changes: No orders of the defined types were placed in this encounter.  Signed, Richardson Dopp, PA-C  01/15/2021 12:05 PM    [facility] [place], [place], [place]  [address] Phone: [phone]; Fax: [phone]

## 2021-01-15 ENCOUNTER — Ambulatory Visit (INDEPENDENT_AMBULATORY_CARE_PROVIDER_SITE_OTHER): Payer: Medicare Other | Admitting: Physician Assistant

## 2021-01-15 ENCOUNTER — Encounter: Payer: Self-pay | Admitting: Physician Assistant

## 2021-01-15 ENCOUNTER — Other Ambulatory Visit: Payer: Self-pay

## 2021-01-15 VITALS — BP 126/76 | HR 71 | Ht 63.0 in | Wt 124.4 lb

## 2021-01-15 DIAGNOSIS — I10 Essential (primary) hypertension: Secondary | ICD-10-CM | POA: Diagnosis not present

## 2021-01-15 DIAGNOSIS — E7800 Pure hypercholesterolemia, unspecified: Secondary | ICD-10-CM | POA: Diagnosis not present

## 2021-01-15 LAB — COMPREHENSIVE METABOLIC PANEL
ALT: 18 IU/L (ref 0–32)
AST: 25 IU/L (ref 0–40)
Albumin/Globulin Ratio: 2.2 (ref 1.2–2.2)
Albumin: 4.6 g/dL (ref 3.7–4.7)
Alkaline Phosphatase: 80 IU/L (ref 44–121)
BUN/Creatinine Ratio: 25 (ref 12–28)
BUN: 18 mg/dL (ref 8–27)
Bilirubin Total: 0.4 mg/dL (ref 0.0–1.2)
CO2: 28 mmol/L (ref 20–29)
Calcium: 9.3 mg/dL (ref 8.7–10.3)
Chloride: 105 mmol/L (ref 96–106)
Creatinine, Ser: 0.72 mg/dL (ref 0.57–1.00)
Globulin, Total: 2.1 g/dL (ref 1.5–4.5)
Glucose: 94 mg/dL (ref 70–99)
Potassium: 4.2 mmol/L (ref 3.5–5.2)
Sodium: 143 mmol/L (ref 134–144)
Total Protein: 6.7 g/dL (ref 6.0–8.5)
eGFR: 86 mL/min/{1.73_m2} (ref >59)

## 2021-01-15 LAB — LIPID PANEL
Chol/HDL Ratio: 2.6 ratio (ref 0.0–4.4)
Cholesterol, Total: 153 mg/dL (ref 100–199)
HDL: 60 mg/dL (ref >39)
LDL Chol Calc (NIH): 76 mg/dL (ref 0–99)
Triglycerides: 90 mg/dL (ref 0–149)
VLDL Cholesterol Cal: 17 mg/dL (ref 5–40)

## 2021-01-15 NOTE — Assessment & Plan Note (Signed)
Continue Simvastatin.  Obtain fasting CMET, Lipids today.  F/u in 1 year.

## 2021-01-15 NOTE — Assessment & Plan Note (Signed)
She recently lost weight and has had lower BPs on medication.  She does not likely require BP medications anymore.  I have asked her to stop her Losartan.  We discussed the appropriate way to monitor BP at home.  She knows to call if her BP increases to > 130/80, consistently.  CMET today.  F/u in 1 year.

## 2021-01-15 NOTE — Patient Instructions (Addendum)
Medication Instructions:  Your physician has recommended you make the following change in your medication:  STOP LOSARTAN   *If you need a refill on your cardiac medications before your next appointment, please call your pharmacy*   Lab Work: TODAY: CMET, LIPIDS If you have labs (blood work) drawn today and your tests are completely normal, you will receive your results only by: [place] (if you have MyChart) OR A paper copy in the mail If you have any lab test that is abnormal or we need to change your treatment, we will call you to review the results.   Testing/Procedures: NONE   Follow-Up: At [facility], you and your health needs are our priority.  As part of our continuing mission to provide you with exceptional heart care, we have created designated Provider Care Teams.  These Care Teams include your primary Cardiologist (physician) and Advanced Practice Providers (APPs -  Physician Assistants and Nurse Practitioners) who all work together to provide you with the care you need, when you need it.  We recommend signing up for the patient portal called "MyChart".  Sign up information is provided on this After Visit Summary.  MyChart is used to connect with patients for Virtual Visits (Telemedicine).  Patients are able to view lab/test results, encounter notes, upcoming appointments, etc.  Non-urgent messages can be sent to your provider as well.   To learn more about what you can do with MyChart, go to [email or web address].    Your next appointment:   1 year(s)  The format for your next appointment:   In Person  Provider:   Richardson Dopp, PA-C  Other Instructions IF YOU BLOOD PRESSURE IS GREATER THAN 130/80 THREE TIME IN A ROW PLEASE CALL OUR OFFICE AT [phone]

## 2021-01-16 DIAGNOSIS — Z1231 Encounter for screening mammogram for malignant neoplasm of breast: Secondary | ICD-10-CM | POA: Diagnosis not present

## 2021-01-16 DIAGNOSIS — Z124 Encounter for screening for malignant neoplasm of cervix: Secondary | ICD-10-CM | POA: Diagnosis not present

## 2021-01-16 DIAGNOSIS — Z6822 Body mass index (BMI) 22.0-22.9, adult: Secondary | ICD-10-CM | POA: Diagnosis not present

## 2021-01-17 DIAGNOSIS — Z124 Encounter for screening for malignant neoplasm of cervix: Secondary | ICD-10-CM | POA: Diagnosis not present

## 2021-02-23 ENCOUNTER — Other Ambulatory Visit: Payer: Self-pay | Admitting: Cardiovascular Disease

## 2021-04-19 DIAGNOSIS — H524 Presbyopia: Secondary | ICD-10-CM | POA: Diagnosis not present

## 2021-04-19 DIAGNOSIS — H52223 Regular astigmatism, bilateral: Secondary | ICD-10-CM | POA: Diagnosis not present

## 2021-04-19 DIAGNOSIS — H25013 Cortical age-related cataract, bilateral: Secondary | ICD-10-CM | POA: Diagnosis not present

## 2021-04-19 DIAGNOSIS — H2513 Age-related nuclear cataract, bilateral: Secondary | ICD-10-CM | POA: Diagnosis not present

## 2021-04-19 DIAGNOSIS — H5213 Myopia, bilateral: Secondary | ICD-10-CM | POA: Diagnosis not present

## 2021-04-19 DIAGNOSIS — H25033 Anterior subcapsular polar age-related cataract, bilateral: Secondary | ICD-10-CM | POA: Diagnosis not present

## 2021-12-12 DIAGNOSIS — M810 Age-related osteoporosis without current pathological fracture: Secondary | ICD-10-CM | POA: Diagnosis not present

## 2021-12-12 DIAGNOSIS — E7800 Pure hypercholesterolemia, unspecified: Secondary | ICD-10-CM | POA: Diagnosis not present

## 2021-12-12 DIAGNOSIS — I10 Essential (primary) hypertension: Secondary | ICD-10-CM | POA: Diagnosis not present

## 2021-12-17 DIAGNOSIS — M810 Age-related osteoporosis without current pathological fracture: Secondary | ICD-10-CM | POA: Diagnosis not present

## 2021-12-17 DIAGNOSIS — E7800 Pure hypercholesterolemia, unspecified: Secondary | ICD-10-CM | POA: Diagnosis not present

## 2021-12-17 DIAGNOSIS — Z23 Encounter for immunization: Secondary | ICD-10-CM | POA: Diagnosis not present

## 2021-12-17 DIAGNOSIS — Z0000 Encounter for general adult medical examination without abnormal findings: Secondary | ICD-10-CM | POA: Diagnosis not present

## 2021-12-17 DIAGNOSIS — Z6822 Body mass index (BMI) 22.0-22.9, adult: Secondary | ICD-10-CM | POA: Diagnosis not present

## 2021-12-17 DIAGNOSIS — Z79899 Other long term (current) drug therapy: Secondary | ICD-10-CM | POA: Diagnosis not present

## 2022-01-20 NOTE — Progress Notes (Unsigned)
Cardiology Office Note:    Date:  01/21/2022   ID:  Misty Morales, DOB 08/27/1945, MRN [id number]  PCP:  Lawerance Cruel, [facility] Cardiologist:  Mertie Moores, MD Cardiology APP:  Sharmon Revere    Referring MD: Lawerance Cruel, MD   Chief Complaint:  F/u for HTN, HL    Patient Profile: Hypertension  BP ? in 12/2020 >> Losartan DCd Hyperlipidemia  Hx of syncope 01/2014 Event monitor, echocardiogram normal Abnormal ECG Myoview 4/19: no significant ischemia; low risk Echocardiogram 4/19: EF 55-60, Gr 1 DD, mild LVH, mild MR, mild LAE     History of Present Illness:   Misty Morales is a 76 y.o. female with the above problem list.  She was last seen 01/15/21. She returns for f/u. She is here alone. She had a bad fall during a tennis match in May and has decided to stop playing. She still walks her dog and does yard work. Her BPs at home have been optimal. She has not had chest pain, shortness of breath, syncope.      EKG:  NSR, HR 84, no ST-TW changes, PRWP, QTc 441 ms, no changes     Reviewed and updated this encounter:  Tobacco  Allergies  Meds  Problems  Med Hx  Surg Hx  Fam Hx      Review of Systems  Cardiovascular:  Negative for claudication.     Labs/Other Test Reviewed:    Recent Labs: No results found for requested labs within last 365 days.   Recent Lipid Panel No results for input(s): "CHOL", "TRIG", "HDL", "VLDL", "LDLCALC", "LDLDIRECT" in the last 8760 hours.    Risk Assessment/Calculations/Metrics:              Physical Exam:    VS:  BP 120/70   Pulse 84   Ht '5\' 3"'$  (1.6 m)   Wt 123 lb 12.8 oz (56.2 kg)   SpO2 97%   BMI 21.93 kg/m     Wt Readings from Last 3 Encounters:  01/21/22 123 lb 12.8 oz (56.2 kg)  01/15/21 124 lb 6.4 oz (56.4 kg)  12/16/19 136 lb (61.7 kg)    Constitutional:      Appearance: Healthy appearance. Not in distress.  Neck:     Vascular: No carotid bruit. JVD normal.   Pulmonary:     Effort: Pulmonary effort is normal.     Breath sounds: No wheezing. No rales.  Cardiovascular:     Normal rate. Regular rhythm. Normal S1. Normal S2.      Murmurs: There is no murmur.     Comments: DP/PT 2+ Bilaterally Edema:    Peripheral edema absent.  Abdominal:     Palpations: Abdomen is soft.  Skin:    General: Skin is warm and dry.  Neurological:     Mental Status: Alert and oriented to person, place and time.         ASSESSMENT & PLAN:   Essential hypertension BP remains controlled without medications.  Hypercholesterolemia Labs from [facility] tool reviewed and interpreted. 12/12/21: HDL 63, LDL 77, SCr 0.72, K+ 4.5, ALT 20. LDL optimal on most recent lab work.  Continue current Rx. She did inquire about CAC score. She does not have a significant FHx of CAD and is already on a statin. A CAC score would not likely change our management any. She is not currently interested in pursuing a CAC score.  Dispo:  Return in about 1 year (around 01/22/2023) for Routine Follow Up, w/ Richardson Dopp, PA-C.   Medication Adjustments/Labs and Tests Ordered: Current medicines are reviewed at length with the patient today.  Concerns regarding medicines are outlined above.  Tests Ordered: Orders Placed This Encounter  Procedures   EKG 12-Lead   EKG 12-Lead   Medication Changes: No orders of the defined types were placed in this encounter.  Signed, Richardson Dopp, PA-C  01/21/2022 10:55 AM    [facility] [place], [place], [place]  [address] Phone: [phone]; Fax: [phone]

## 2022-01-21 ENCOUNTER — Encounter: Payer: Self-pay | Admitting: Physician Assistant

## 2022-01-21 ENCOUNTER — Ambulatory Visit: Payer: Medicare Other | Attending: Physician Assistant | Admitting: Physician Assistant

## 2022-01-21 VITALS — BP 120/70 | HR 84 | Ht 63.0 in | Wt 123.8 lb

## 2022-01-21 DIAGNOSIS — E7800 Pure hypercholesterolemia, unspecified: Secondary | ICD-10-CM | POA: Insufficient documentation

## 2022-01-21 DIAGNOSIS — I10 Essential (primary) hypertension: Secondary | ICD-10-CM | POA: Insufficient documentation

## 2022-01-21 NOTE — Patient Instructions (Signed)
Medication Instructions:  NO CHANGES *If you need a refill on your cardiac medications before your next appointment, please call your pharmacy*   Lab Work: NONE If you have labs (blood work) drawn today and your tests are completely normal, you will receive your results only by: [place] (if you have MyChart) OR A paper copy in the mail If you have any lab test that is abnormal or we need to change your treatment, we will call you to review the results.   Testing/Procedures: NONE   Follow-Up: At [facility], you and your health needs are our priority.  As part of our continuing mission to provide you with exceptional heart care, we have created designated Provider Care Teams.  These Care Teams include your primary Cardiologist (physician) and Advanced Practice Providers (APPs -  Physician Assistants and Nurse Practitioners) who all work together to provide you with the care you need, when you need it.  We recommend signing up for the patient portal called "MyChart".  Sign up information is provided on this After Visit Summary.  MyChart is used to connect with patients for Virtual Visits (Telemedicine).  Patients are able to view lab/test results, encounter notes, upcoming appointments, etc.  Non-urgent messages can be sent to your provider as well.   To learn more about what you can do with MyChart, go to [email or web address].    Your next appointment:   1 year(s)  The format for your next appointment:   In Person  Provider:    Richardson Dopp PA  Other Instructions NONE  Important Information About Sugar

## 2022-01-21 NOTE — Assessment & Plan Note (Signed)
Labs from [place] reviewed and interpreted. 12/12/21: HDL 63, LDL 77, SCr 0.72, K+ 4.5, ALT 20. LDL optimal on most recent lab work.  Continue current Rx. She did inquire about CAC score. She does not have a significant FHx of CAD and is already on a statin. A CAC score would not likely change our management any. She is not currently interested in pursuing a CAC score.

## 2022-01-21 NOTE — Assessment & Plan Note (Signed)
BP remains controlled without medications.

## 2022-02-12 DIAGNOSIS — Z13820 Encounter for screening for osteoporosis: Secondary | ICD-10-CM | POA: Diagnosis not present

## 2022-02-12 DIAGNOSIS — N958 Other specified menopausal and perimenopausal disorders: Secondary | ICD-10-CM | POA: Diagnosis not present

## 2022-02-12 DIAGNOSIS — Z6821 Body mass index (BMI) 21.0-21.9, adult: Secondary | ICD-10-CM | POA: Diagnosis not present

## 2022-02-12 DIAGNOSIS — Z01419 Encounter for gynecological examination (general) (routine) without abnormal findings: Secondary | ICD-10-CM | POA: Diagnosis not present

## 2022-02-12 DIAGNOSIS — M816 Localized osteoporosis [Lequesne]: Secondary | ICD-10-CM | POA: Diagnosis not present

## 2022-02-12 DIAGNOSIS — Z1231 Encounter for screening mammogram for malignant neoplasm of breast: Secondary | ICD-10-CM | POA: Diagnosis not present

## 2022-02-19 DIAGNOSIS — Z1211 Encounter for screening for malignant neoplasm of colon: Secondary | ICD-10-CM | POA: Diagnosis not present

## 2022-02-25 LAB — COLOGUARD: COLOGUARD: POSITIVE — AB

## 2022-03-03 ENCOUNTER — Telehealth: Payer: Self-pay | Admitting: *Deleted

## 2022-03-03 ENCOUNTER — Other Ambulatory Visit: Payer: Self-pay | Admitting: *Deleted

## 2022-03-03 ENCOUNTER — Telehealth: Payer: Self-pay | Admitting: Gastroenterology

## 2022-03-03 DIAGNOSIS — R195 Other fecal abnormalities: Secondary | ICD-10-CM

## 2022-03-03 DIAGNOSIS — Z1211 Encounter for screening for malignant neoplasm of colon: Secondary | ICD-10-CM

## 2022-03-03 MED ORDER — PEG 3350-KCL-NABCB-NACL-NASULF 236 G PO SOLR: 4000.0000 mL | Freq: Once | ORAL | 0 refills | Status: AC

## 2022-03-03 NOTE — Telephone Encounter (Signed)
Gastroenterology Pre-Procedure Review  Request Date: 03/18/2022 Requesting Physician: Dr. Marius Ditch  PATIENT REVIEW QUESTIONS: The patient responded to the following health history questions as indicated:    1. Are you having any GI issues? no 2. Do you have a personal history of Polyps?  Last colonoscopy was about 5 years, not sure if there were any polyps 3. Do you have a family history of Colon Cancer or Polyps? yes (mother's side) 4. Diabetes Mellitus? no 5. Joint replacements in the past 12 months?no 6. Major health problems in the past 3 months?no 7. Any artificial heart valves, MVP, or defibrillator?no    MEDICATIONS & ALLERGIES:    Patient reports the following regarding taking any anticoagulation/antiplatelet therapy:   Plavix, Coumadin, Eliquis, Xarelto, Lovenox, Pradaxa, Brilinta, or Effient? no Aspirin? no  Patient confirms/reports the following medications:  Current Outpatient Medications  Medication Sig Dispense Refill   polyethylene glycol (GOLYTELY) 236 g solution Take 4,000 mLs by mouth once for 1 dose. 4000 mL 0   Ascorbic Acid (VITAMIN C) 1000 MG tablet Take 1,000 mg by mouth daily.     calcium-vitamin D (OSCAL WITH D) 500-200 MG-UNIT per tablet Take 1 tablet by mouth daily. Taking 750 bid     Cholecalciferol (VITAMIN D PO) Take 1,000 Int'l Units by mouth daily.       Cyanocobalamin (VITAMIN B-12 PO) Take 1 tablet by mouth daily.     Ferrous Sulfate (IRON PO) Take 1 tablet by mouth 3 (three) times a week. 3 times weekly     Lysine 1000 MG TABS Take 1 tablet by mouth daily.     magnesium oxide (MAG-OX) 400 MG tablet Take 400 mg by mouth daily.     Multiple Vitamin (MULTIVITAMIN) tablet Take 1 tablet by mouth daily.       Omega-3 Fatty Acids (FISH OIL) 1000 MG CAPS Take 1,000 mg by mouth daily.     simvastatin (ZOCOR) 40 MG tablet TAKE 1 TABLET BY MOUTH AT  BEDTIME 90 tablet 3   VITAMIN A PO Take 1 tablet by mouth daily.     vitamin E 100 UNIT capsule Take 100 Units by  mouth daily. Unsure of units     No current facility-administered medications for this visit.    Patient confirms/reports the following allergies:  Allergies  Allergen Reactions   Adhesive [Tape] Itching    No orders of the defined types were placed in this encounter.   AUTHORIZATION INFORMATION Primary Insurance: 1D#: Group #:  Secondary Insurance: 1D#: Group #:  SCHEDULE INFORMATION: Date: 03/18/2022 Time: Location: [place]

## 2022-03-03 NOTE — Telephone Encounter (Signed)
Patient is schedule for 03/18/2022 with Dr Marius Ditch.

## 2022-03-03 NOTE — Telephone Encounter (Signed)
Patient calling to schedule a colonoscopy. I am unsure of what the exact diagnosis is because on Ginger's communication note. Colonoscopy or New Patient appointment?

## 2022-03-18 ENCOUNTER — Encounter
Admission: RE | Disposition: A | Discharge status: Home / Self Care | Payer: Self-pay | Source: Home / Self Care | Attending: Gastroenterology

## 2022-03-18 ENCOUNTER — Ambulatory Visit
Admission: RE | Admit: 2022-03-18 | Discharge: 2022-03-18 | Disposition: A | Discharge status: Home / Self Care | Payer: Medicare Other | Attending: Gastroenterology | Admitting: Gastroenterology

## 2022-03-18 ENCOUNTER — Encounter: Payer: Self-pay | Admitting: Gastroenterology

## 2022-03-18 ENCOUNTER — Ambulatory Visit: Payer: Medicare Other | Admitting: Certified Registered"

## 2022-03-18 DIAGNOSIS — K5521 Angiodysplasia of colon with hemorrhage: Secondary | ICD-10-CM | POA: Diagnosis not present

## 2022-03-18 DIAGNOSIS — K644 Residual hemorrhoidal skin tags: Secondary | ICD-10-CM | POA: Insufficient documentation

## 2022-03-18 DIAGNOSIS — Z1211 Encounter for screening for malignant neoplasm of colon: Secondary | ICD-10-CM

## 2022-03-18 DIAGNOSIS — D124 Benign neoplasm of descending colon: Secondary | ICD-10-CM | POA: Insufficient documentation

## 2022-03-18 DIAGNOSIS — I10 Essential (primary) hypertension: Secondary | ICD-10-CM | POA: Diagnosis not present

## 2022-03-18 DIAGNOSIS — R195 Other fecal abnormalities: Secondary | ICD-10-CM | POA: Diagnosis not present

## 2022-03-18 DIAGNOSIS — D120 Benign neoplasm of cecum: Secondary | ICD-10-CM | POA: Diagnosis not present

## 2022-03-18 DIAGNOSIS — E7800 Pure hypercholesterolemia, unspecified: Secondary | ICD-10-CM | POA: Insufficient documentation

## 2022-03-18 HISTORY — PX: COLONOSCOPY WITH PROPOFOL: SHX5780

## 2022-03-18 SURGERY — COLONOSCOPY WITH PROPOFOL
Anesthesia: General

## 2022-03-18 MED ORDER — ONDANSETRON HCL 4 MG/2ML IJ SOLN
INTRAMUSCULAR | Status: DC | PRN
  Administered 2022-03-18: 4 mg via INTRAVENOUS

## 2022-03-18 MED ORDER — SODIUM CHLORIDE 0.9 % IV SOLN: INTRAVENOUS | Status: DC | PRN

## 2022-03-18 MED ORDER — PROPOFOL 500 MG/50ML IV EMUL
INTRAVENOUS | Status: DC | PRN
  Administered 2022-03-18: 50 mg via INTRAVENOUS
  Administered 2022-03-18: 30 mg via INTRAVENOUS
  Administered 2022-03-18: 40 mg via INTRAVENOUS
  Administered 2022-03-18: 120 ug/kg/min via INTRAVENOUS
  Administered 2022-03-18: 50 mg via INTRAVENOUS

## 2022-03-18 MED ORDER — PHENYLEPHRINE 80 MCG/ML (10ML) SYRINGE FOR IV PUSH (FOR BLOOD PRESSURE SUPPORT)
PREFILLED_SYRINGE | INTRAVENOUS | Status: AC
  Filled 2022-03-18: qty 10

## 2022-03-18 MED ORDER — SODIUM CHLORIDE 0.9 % IV SOLN: INTRAVENOUS | Status: DC

## 2022-03-18 MED ORDER — SODIUM CHLORIDE (PF) 0.9 % IJ SOLN
PREFILLED_SYRINGE | INTRAMUSCULAR | Status: DC | PRN
  Administered 2022-03-18: 5 mL

## 2022-03-18 MED ORDER — PROPOFOL 10 MG/ML IV BOLUS
INTRAVENOUS | Status: AC
  Filled 2022-03-18: qty 40

## 2022-03-18 MED ORDER — EPINEPHRINE 1 MG/10ML IJ SOSY
PREFILLED_SYRINGE | INTRAMUSCULAR | Status: AC
  Filled 2022-03-18: qty 10

## 2022-03-18 MED ORDER — STERILE WATER FOR IRRIGATION IR SOLN
Status: DC | PRN
  Administered 2022-03-18: 60 mL

## 2022-03-18 NOTE — Anesthesia Postprocedure Evaluation (Signed)
Anesthesia Post Note  Patient: Misty Morales  Procedure(s) Performed: COLONOSCOPY WITH PROPOFOL  Patient location during evaluation: Endoscopy Anesthesia Type: General Level of consciousness: awake and alert Pain management: pain level controlled Vital Signs Assessment: post-procedure vital signs reviewed and stable Respiratory status: spontaneous breathing, nonlabored ventilation, respiratory function stable and patient connected to nasal cannula oxygen Cardiovascular status: blood pressure returned to baseline and stable Postop Assessment: no apparent nausea or vomiting Anesthetic complications: no   No notable events documented.   Last Vitals:  Vitals:   03/18/22 1312 03/18/22 1322  BP: (!) 158/94 (!) 188/77  Pulse: 75 65  Resp: 19 18  Temp: (!) 35.8 C   SpO2: 100% 100%    Last Pain:  Vitals:   03/18/22 1322  TempSrc:   PainSc: 0-No pain                 Arita Miss

## 2022-03-18 NOTE — Op Note (Signed)
[facility] Gastroenterology Patient Name: Misty Morales Procedure Date: 03/18/2022 12:10 PM MRN: [id number] Account #: [id number] Date of Birth: 12/13/1945 Admit Type: Outpatient Age: 77 Room: [facility] ENDO ROOM 3 Gender: Female Note Status: Finalized Instrument Name: Peds Colonoscope [id number] Procedure:             Colonoscopy Indications:           Positive Cologuard test Providers:             Lin Landsman MD, MD Medicines:             General Anesthesia Complications:         No immediate complications. Estimated blood loss:                         Minimal. Procedure:             Pre-Anesthesia Assessment:                        - Prior to the procedure, a History and Physical was                         performed, and patient medications and allergies were                         reviewed. The patient is competent. The risks and                         benefits of the procedure and the sedation options and                         risks were discussed with the patient. All questions                         were answered and informed consent was obtained.                         Patient identification and proposed procedure were                         verified by the physician, the nurse, the                         anesthesiologist, the anesthetist and the technician                         in the pre-procedure area in the procedure room in the                         endoscopy suite. Mental Status Examination: alert and                         oriented. Airway Examination: normal oropharyngeal                         airway and neck mobility. Respiratory Examination:                         clear to auscultation. CV Examination: normal.  Prophylactic Antibiotics: The patient does not require                         prophylactic antibiotics. Prior Anticoagulants: The                         patient has taken no anticoagulant or  antiplatelet                         agents. ASA Grade Assessment: II - A patient with mild                         systemic disease. After reviewing the risks and                         benefits, the patient was deemed in satisfactory                         condition to undergo the procedure. The anesthesia                         plan was to use general anesthesia. Immediately prior                         to administration of medications, the patient was                         re-assessed for adequacy to receive sedatives. The                         heart rate, respiratory rate, oxygen saturations,                         blood pressure, adequacy of pulmonary ventilation, and                         response to care were monitored throughout the                         procedure. The physical status of the patient was                         re-assessed after the procedure.                        After obtaining informed consent, the colonoscope was                         passed under direct vision. Throughout the procedure,                         the patient's blood pressure, pulse, and oxygen                         saturations were monitored continuously. The                         Colonoscope was introduced through the anus and  advanced to the the terminal ileum, with                         identification of the appendiceal orifice and IC                         valve. The colonoscopy was performed without                         difficulty. The patient tolerated the procedure well.                         The quality of the bowel preparation was evaluated                         using the BBPS [facility] Bowel Preparation Scale) with                         scores of: Right Colon = 3, Transverse Colon = 3 and                         Left Colon = 3 (entire mucosa seen well with no                         residual staining, small fragments of stool or  opaque                         liquid). The total BBPS score equals 9. The terminal                         ileum, ileocecal valve, appendiceal orifice, and                         rectum were photographed. Findings:      The perianal and digital rectal examinations were normal. Pertinent       negatives include normal sphincter tone and no palpable rectal lesions.      The terminal ileum appeared normal.      An 8 mm polyp was found in the cecum. The polyp was sessile. The polyp       was removed with a cold snare. Resection and retrieval were complete.       Estimated blood loss was minimal. To prevent bleeding after the       polypectomy, one hemostatic clip was successfully placed (MR safe). Clip       manufacturer: [employer]. There was no bleeding at the end of the       procedure. Estimated blood loss was minimal.      A 3 mm polyp was found in the cecum. The polyp was sessile. The polyp       was removed with a cold snare. Resection and retrieval were complete.       Estimated blood loss: none.      Two large localized angiodysplastic lesions with bleeding on contact       were found in the proximal ascending colon. Coagulation for hemostasis       using argon plasma was successful. Area was successfully injected with 5       mL of a 0.1 mg/mL  solution of epinephrine for hemostasis. Estimated       blood loss was minimal.      A 5 mm polyp was found in the descending colon. The polyp was sessile.       The polyp was removed with a cold snare. Resection and retrieval were       complete. Estimated blood loss: none.      Non-bleeding external hemorrhoids were found during retroflexion. The       hemorrhoids were large. Impression:            - The examined portion of the ileum was normal.                        - One 8 mm polyp in the cecum, removed with a cold                         snare. Resected and retrieved. Clip manufacturer:                         [employer]. Clip (MR safe) was placed.                        - One 3 mm polyp in the cecum, removed with a cold                         snare. Resected and retrieved.                        - Two colonic angiodysplastic lesions. Treated with                         argon plasma coagulation (APC). Injected.                        - One 5 mm polyp in the descending colon, removed with                         a cold snare. Resected and retrieved.                        - Non-bleeding external hemorrhoids. Recommendation:        - Discharge patient to home (with escort).                        - Resume previous diet today.                        - Continue present medications.                        - Await pathology results.                        - Repeat colonoscopy in 3 - 5 years for surveillance                         based on pathology results. Procedure Code(s):     --- Professional ---  64158, 59, Colonoscopy, flexible; with control of                         bleeding, any method                        45385, Colonoscopy, flexible; with removal of                         tumor(s), polyp(s), or other lesion(s) by snare                         technique Diagnosis Code(s):     --- Professional ---                        K64.4, Residual hemorrhoidal skin tags                        D12.0, Benign neoplasm of cecum                        D12.4, Benign neoplasm of descending colon                        K55.20, Angiodysplasia of colon without hemorrhage                        R19.5, Other fecal abnormalities CPT copyright 2022 American Medical Association. All rights reserved. The codes documented in this report are preliminary and upon coder review may  be revised to meet current compliance requirements. Dr. Ulyess Mort Lin Landsman MD, MD 03/18/2022 1:02:40 PM This report has been signed electronically. Number of Addenda: 0 Note Initiated On: 03/18/2022  12:10 PM Scope Withdrawal Time: 0 hours 26 minutes 28 seconds  Total Procedure Duration: 0 hours 31 minutes 59 seconds  Estimated Blood Loss:  Estimated blood loss was minimal. Estimated blood                         loss: none.      [facility]

## 2022-03-18 NOTE — Transfer of Care (Signed)
Immediate Anesthesia Transfer of Care Note  Patient: Misty Morales  Procedure(s) Performed: COLONOSCOPY WITH PROPOFOL  Patient Location: PACU  Anesthesia Type:General  Level of Consciousness: awake, alert , and oriented  Airway & Oxygen Therapy: Patient Spontanous Breathing  Post-op Assessment: Report given to RN and Post -op Vital signs reviewed and stable  Post vital signs: Reviewed and stable  Last Vitals:  Vitals Value Taken Time  BP 164/72 03/18/22 1302  Temp    Pulse 81 03/18/22 1302  Resp 19 03/18/22 1302  SpO2 100 % 03/18/22 1302    Last Pain:  Vitals:   03/18/22 1122  TempSrc: Temporal  PainSc: 0-No pain         Complications: No notable events documented.

## 2022-03-18 NOTE — H&P (Signed)
Misty Darby, MD [address]  [place]  [place], [place] [address]  Main: [phone]  Fax: [phone] Pager: [phone]  Primary Care Physician:  Misty Cruel, MD Primary Gastroenterologist:  Dr. Cephas Morales  Pre-Procedure History & Physical: HPI:  Misty Morales is a 77 y.o. female is here for an colonoscopy.   Past Medical History:  Diagnosis Date   Breast swelling    right breast swelling and pain   Chills    Clostridium difficile colitis    Diarrhea    Fever    Fracture 05/14/2007   Malunion, fracture, right ulna   History of echocardiogram    Echo 4/19: mild LVH, EF 55-60, no RWMA, Gr 1 DD, mild MR, mild LAE   History of nuclear stress test    Nuclear stress test 4/19: Medium size, moderate intensity mostly fixed inferoseptal, anteroseptal and septal perfusion defect (SDS 3). This not felt to represent significant ischemia. LVEF 61% with normal wall motion. Overall a low risk study.    Hypercholesterolemia    Hypertension    Leukocytosis    probably secondary to #1, improved, needs outpatient follow-up.   Mastitis    Right breast mastitis with septicemia, resolving.  Patient clinically stable on discharge   Osteoporosis     Past Surgical History:  Procedure Laterality Date   arm surg  arm surgery 2010   fractured right forearm    ROTATOR CUFF REPAIR  unknown    Prior to Admission medications   Medication Sig Start Date End Date Taking? Authorizing Provider  Ascorbic Acid (VITAMIN C) 1000 MG tablet Take 1,000 mg by mouth daily.   Yes [provider]  calcium-vitamin D (OSCAL WITH D) 500-200 MG-UNIT per tablet Take 1 tablet by mouth daily. Taking 750 bid   Yes [provider]  Cholecalciferol (VITAMIN D PO) Take 1,000 Int'l Units by mouth daily.     Yes [provider]  Cyanocobalamin (VITAMIN B-12 PO) Take 1 tablet by mouth daily.   Yes [provider]  Ferrous Sulfate (IRON PO) Take 1 tablet by mouth  3 (three) times a week. 3 times weekly   Yes [provider]  Lysine 1000 MG TABS Take 1 tablet by mouth daily.   Yes [provider]  magnesium oxide (MAG-OX) 400 MG tablet Take 400 mg by mouth daily.   Yes [provider]  Multiple Vitamin (MULTIVITAMIN) tablet Take 1 tablet by mouth daily.     Yes [provider]  simvastatin (ZOCOR) 40 MG tablet TAKE 1 TABLET BY MOUTH AT  BEDTIME 02/25/21  Yes Nahser, Wonda Cheng, MD  VITAMIN A PO Take 1 tablet by mouth daily.   Yes [provider]  vitamin E 100 UNIT capsule Take 100 Units by mouth daily. Unsure of units   Yes [provider]  Omega-3 Fatty Acids (FISH OIL) 1000 MG CAPS Take 1,000 mg by mouth daily.    [provider]    Allergies as of 03/04/2022 - Review Complete 01/21/2022  Allergen Reaction Noted   Adhesive [tape] Itching 03/21/2014    Family History  Problem Relation Age of Onset   Heart disease Mother    Diabetes Mother    Heart disease Father     Social History   Socioeconomic History   Marital status: Married    Spouse name: Not on file   Number of children: Not on file   Years of education: Not on file  Highest education level: Not on file  Occupational History   Not on file  Tobacco Use   Smoking status: Never   Smokeless tobacco: Never  Substance and Sexual Activity   Alcohol use: Yes    Comment: occasionally   Drug use: Never   Sexual activity: Not on file  Other Topics Concern   Not on file  Social History Narrative   Not on file   Social Determinants of Health   Financial Resource Strain: Not on file  Food Insecurity: Not on file  Transportation Needs: Not on file  Physical Activity: Not on file  Stress: Not on file  Social Connections: Not on file  Intimate Partner Violence: Not on file    Review of Systems: See HPI, otherwise negative ROS  Physical Exam: BP 127/72   Pulse 87   Temp 97.7 F (36.5 C) (Temporal)   Resp 20   Ht  '5\' 3"'$  (1.6 m)   Wt 54.7 kg   SpO2 99%   BMI 21.36 kg/m  General:   Alert,  pleasant and cooperative in NAD Head:  Normocephalic and atraumatic. Neck:  Supple; no masses or thyromegaly. Lungs:  Clear throughout to auscultation.    Heart:  Regular rate and rhythm. Abdomen:  Soft, nontender and nondistended. Normal bowel sounds, without guarding, and without rebound.   Neurologic:  Alert and  oriented x4;  grossly normal neurologically.  Impression/Plan: Misty Morales is here for an colonoscopy to be performed for cologuard postitive  Risks, benefits, limitations, and alternatives regarding  colonoscopy have been reviewed with the patient.  Questions have been answered.  All parties agreeable.   Sherri Sear, MD  03/18/2022, 12:08 PM

## 2022-03-18 NOTE — Anesthesia Preprocedure Evaluation (Signed)
Anesthesia Evaluation  Patient identified by MRN, date of birth, ID band Patient awake    Reviewed: Allergy & Precautions, NPO status , Patient's Chart, lab work & pertinent test results  History of Anesthesia Complications Negative for: history of anesthetic complications  Airway Mallampati: II  TM Distance: >3 FB Neck ROM: Full    Dental no notable dental hx. (+) Teeth Intact   Pulmonary neg pulmonary ROS, neg sleep apnea, neg COPD, Patient abstained from smoking.Not current smoker   Pulmonary exam normal breath sounds clear to auscultation       Cardiovascular Exercise Tolerance: Good METShypertension, (-) CAD and (-) Past MI (-) dysrhythmias  Rhythm:Regular Rate:Normal - Systolic murmurs    Neuro/Psych negative neurological ROS  negative psych ROS   GI/Hepatic ,neg GERD  ,,(+)     (-) substance abuse    Endo/Other  neg diabetes    Renal/GU negative Renal ROS     Musculoskeletal   Abdominal   Peds  Hematology   Anesthesia Other Findings Past Medical History: No date: Breast swelling     Comment:  right breast swelling and pain No date: Chills No date: Clostridium difficile colitis No date: Diarrhea No date: Fever 05/14/2007: Fracture     Comment:  Malunion, fracture, right ulna No date: History of echocardiogram     Comment:  Echo 4/19: mild LVH, EF 55-60, no RWMA, Gr 1 DD, mild               MR, mild LAE No date: History of nuclear stress test     Comment:  Nuclear stress test 4/19: Medium size, moderate               intensity mostly fixed inferoseptal, anteroseptal and               septal perfusion defect (SDS 3). This not felt to               represent significant ischemia. LVEF 61% with normal wall              motion. Overall a low risk study.  No date: Hypercholesterolemia No date: Hypertension No date: Leukocytosis     Comment:  probably secondary to #1, improved, needs outpatient                follow-up. No date: Mastitis     Comment:  Right breast mastitis with septicemia, resolving.                Patient clinically stable on discharge No date: Osteoporosis  Reproductive/Obstetrics                             Anesthesia Physical Anesthesia Plan  ASA: 2  Anesthesia Plan: General   Post-op Pain Management: Minimal or no pain anticipated   Induction: Intravenous  PONV Risk Score and Plan: 3 and Propofol infusion, TIVA and Ondansetron  Airway Management Planned: Nasal Cannula  Additional Equipment: None  Intra-op Plan:   Post-operative Plan:   Informed Consent: I have reviewed the patients History and Physical, chart, labs and discussed the procedure including the risks, benefits and alternatives for the proposed anesthesia with the patient or authorized representative who has indicated his/her understanding and acceptance.     Dental advisory given  Plan Discussed with: CRNA and Surgeon  Anesthesia Plan Comments: (Discussed risks of anesthesia with patient, including possibility of difficulty with spontaneous ventilation under anesthesia necessitating  airway intervention, PONV, and rare risks such as cardiac or respiratory or neurological events, and allergic reactions. Discussed the role of CRNA in patient's perioperative care. Patient understands.)       Anesthesia Quick Evaluation

## 2022-03-19 LAB — SURGICAL PATHOLOGY

## 2022-03-20 ENCOUNTER — Encounter: Payer: Self-pay | Admitting: Gastroenterology

## 2022-03-20 ENCOUNTER — Telehealth: Payer: Self-pay

## 2022-03-20 DIAGNOSIS — R195 Other fecal abnormalities: Secondary | ICD-10-CM | POA: Diagnosis not present

## 2022-03-20 NOTE — Telephone Encounter (Signed)
-----  Message from Lin Landsman, MD sent at 03/20/2022 12:05 PM EST ----- Recommend patient come in for CBC to check her hemoglobin.  During colonoscopy, I treated 2 bleeding spots.  Just want to make sure she is not anemic.  [employer]

## 2022-03-20 NOTE — Telephone Encounter (Signed)
Patient verbalized understanding of instructions she will go for labs

## 2022-03-21 ENCOUNTER — Telehealth: Payer: Self-pay

## 2022-03-21 DIAGNOSIS — D649 Anemia, unspecified: Secondary | ICD-10-CM

## 2022-03-21 LAB — CBC
Hematocrit: 34.9 % (ref 34.0–46.6)
Hemoglobin: 11.5 g/dL (ref 11.1–15.9)
MCH: 30.4 pg (ref 26.6–33.0)
MCHC: 33 g/dL (ref 31.5–35.7)
MCV: 92 fL (ref 79–97)
Platelets: 219 10*3/uL (ref 150–450)
RBC: 3.78 x10E6/uL (ref 3.77–5.28)
RDW: 13.6 % (ref 11.7–15.4)
WBC: 5.6 10*3/uL (ref 3.4–10.8)

## 2022-03-21 NOTE — Telephone Encounter (Signed)
-----   Message from [facility], MD sent at 03/21/2022  8:23 AM EST ----- Hb is slightly low compared to the past. Recommend iron panel, B12 and folate levels  RV

## 2022-03-21 NOTE — Telephone Encounter (Signed)
Patient verablized understanding of results. Patient states she will go early next week to have the labs done. She states she usually take 4 iron pills a week and Vitamin b12 daily

## 2022-03-24 DIAGNOSIS — D649 Anemia, unspecified: Secondary | ICD-10-CM | POA: Diagnosis not present

## 2022-03-25 ENCOUNTER — Telehealth: Payer: Self-pay | Admitting: Gastroenterology

## 2022-03-25 LAB — B12 AND FOLATE PANEL
Folate: >20 ng/mL (ref >3.0)
Vitamin B-12: >2000 pg/mL — ABNORMAL HIGH (ref 232–1245)

## 2022-03-25 LAB — IRON,TIBC AND FERRITIN PANEL
Ferritin: 221 ng/mL — ABNORMAL HIGH (ref 15–150)
Iron Saturation: 25 % (ref 15–55)
Iron: 75 ug/dL (ref 27–139)
Total Iron Binding Capacity: 295 ug/dL (ref 250–450)
UIBC: 220 ug/dL (ref 118–369)

## 2022-03-25 NOTE — Telephone Encounter (Signed)
[phone]  Pet pt had lab work yesterday but she is confused as why she had different lab work. Pt thought that she was getting repeat labs from last time. PT wants to know why she had different labs this time?

## 2022-03-26 ENCOUNTER — Inpatient Hospital Stay
Admission: EM | Admit: 2022-03-26 | Discharge: 2022-03-28 | DRG: 393 | Disposition: A | Discharge status: Home / Self Care | Payer: Medicare Other | Attending: Internal Medicine | Admitting: Internal Medicine

## 2022-03-26 ENCOUNTER — Other Ambulatory Visit: Payer: Self-pay

## 2022-03-26 ENCOUNTER — Emergency Department: Payer: Medicare Other

## 2022-03-26 DIAGNOSIS — I10 Essential (primary) hypertension: Secondary | ICD-10-CM | POA: Diagnosis present

## 2022-03-26 DIAGNOSIS — Z833 Family history of diabetes mellitus: Secondary | ICD-10-CM

## 2022-03-26 DIAGNOSIS — I509 Heart failure, unspecified: Secondary | ICD-10-CM | POA: Diagnosis not present

## 2022-03-26 DIAGNOSIS — D509 Iron deficiency anemia, unspecified: Secondary | ICD-10-CM | POA: Diagnosis present

## 2022-03-26 DIAGNOSIS — D62 Acute posthemorrhagic anemia: Secondary | ICD-10-CM | POA: Diagnosis not present

## 2022-03-26 DIAGNOSIS — W19XXXA Unspecified fall, initial encounter: Secondary | ICD-10-CM | POA: Diagnosis not present

## 2022-03-26 DIAGNOSIS — U071 COVID-19: Secondary | ICD-10-CM | POA: Diagnosis not present

## 2022-03-26 DIAGNOSIS — R42 Dizziness and giddiness: Secondary | ICD-10-CM | POA: Diagnosis not present

## 2022-03-26 DIAGNOSIS — R531 Weakness: Secondary | ICD-10-CM | POA: Diagnosis not present

## 2022-03-26 DIAGNOSIS — K922 Gastrointestinal hemorrhage, unspecified: Secondary | ICD-10-CM | POA: Diagnosis not present

## 2022-03-26 DIAGNOSIS — E7800 Pure hypercholesterolemia, unspecified: Secondary | ICD-10-CM | POA: Diagnosis present

## 2022-03-26 DIAGNOSIS — I110 Hypertensive heart disease with heart failure: Secondary | ICD-10-CM | POA: Diagnosis not present

## 2022-03-26 DIAGNOSIS — I951 Orthostatic hypotension: Secondary | ICD-10-CM | POA: Diagnosis not present

## 2022-03-26 DIAGNOSIS — Z888 Allergy status to other drugs, medicaments and biological substances status: Secondary | ICD-10-CM | POA: Diagnosis not present

## 2022-03-26 DIAGNOSIS — K625 Hemorrhage of anus and rectum: Secondary | ICD-10-CM | POA: Diagnosis not present

## 2022-03-26 DIAGNOSIS — Z8249 Family history of ischemic heart disease and other diseases of the circulatory system: Secondary | ICD-10-CM | POA: Diagnosis not present

## 2022-03-26 DIAGNOSIS — R110 Nausea: Secondary | ICD-10-CM | POA: Diagnosis not present

## 2022-03-26 DIAGNOSIS — Y92009 Unspecified place in unspecified non-institutional (private) residence as the place of occurrence of the external cause: Secondary | ICD-10-CM

## 2022-03-26 DIAGNOSIS — Q2730 Arteriovenous malformation, site unspecified: Secondary | ICD-10-CM | POA: Diagnosis not present

## 2022-03-26 DIAGNOSIS — M810 Age-related osteoporosis without current pathological fracture: Secondary | ICD-10-CM | POA: Diagnosis present

## 2022-03-26 DIAGNOSIS — K633 Ulcer of intestine: Principal | ICD-10-CM | POA: Diagnosis present

## 2022-03-26 DIAGNOSIS — K640 First degree hemorrhoids: Secondary | ICD-10-CM | POA: Diagnosis not present

## 2022-03-26 DIAGNOSIS — I5032 Chronic diastolic (congestive) heart failure: Secondary | ICD-10-CM | POA: Diagnosis present

## 2022-03-26 DIAGNOSIS — K644 Residual hemorrhoidal skin tags: Secondary | ICD-10-CM | POA: Diagnosis present

## 2022-03-26 DIAGNOSIS — I959 Hypotension, unspecified: Secondary | ICD-10-CM | POA: Diagnosis not present

## 2022-03-26 DIAGNOSIS — K5520 Angiodysplasia of colon without hemorrhage: Secondary | ICD-10-CM | POA: Diagnosis present

## 2022-03-26 DIAGNOSIS — K921 Melena: Secondary | ICD-10-CM | POA: Diagnosis present

## 2022-03-26 DIAGNOSIS — S0990XA Unspecified injury of head, initial encounter: Secondary | ICD-10-CM | POA: Diagnosis not present

## 2022-03-26 DIAGNOSIS — Z043 Encounter for examination and observation following other accident: Secondary | ICD-10-CM | POA: Diagnosis not present

## 2022-03-26 DIAGNOSIS — R58 Hemorrhage, not elsewhere classified: Secondary | ICD-10-CM | POA: Diagnosis not present

## 2022-03-26 HISTORY — DX: Gastrointestinal hemorrhage, unspecified: K92.2

## 2022-03-26 LAB — CBC
HCT: 25.8 % — ABNORMAL LOW (ref 36.0–46.0)
HCT: 22.8 % — ABNORMAL LOW (ref 36.0–46.0)
HCT: 28.5 % — ABNORMAL LOW (ref 36.0–46.0)
Hemoglobin: 8.6 g/dL — ABNORMAL LOW (ref 12.0–15.0)
Hemoglobin: 7.5 g/dL — ABNORMAL LOW (ref 12.0–15.0)
Hemoglobin: 9.7 g/dL — ABNORMAL LOW (ref 12.0–15.0)
MCH: 31.3 pg (ref 26.0–34.0)
MCH: 31.1 pg (ref 26.0–34.0)
MCH: 30.9 pg (ref 26.0–34.0)
MCHC: 33.3 g/dL (ref 30.0–36.0)
MCHC: 32.9 g/dL (ref 30.0–36.0)
MCHC: 34 g/dL (ref 30.0–36.0)
MCV: 93.8 fL (ref 80.0–100.0)
MCV: 94.6 fL (ref 80.0–100.0)
MCV: 90.8 fL (ref 80.0–100.0)
Platelets: 191 10*3/uL (ref 150–400)
Platelets: 177 10*3/uL (ref 150–400)
Platelets: 191 10*3/uL (ref 150–400)
RBC: 2.75 MIL/uL — ABNORMAL LOW (ref 3.87–5.11)
RBC: 2.41 MIL/uL — ABNORMAL LOW (ref 3.87–5.11)
RBC: 3.14 MIL/uL — ABNORMAL LOW (ref 3.87–5.11)
RDW: 14.1 % (ref 11.5–15.5)
RDW: 14.5 % (ref 11.5–15.5)
RDW: 14.4 % (ref 11.5–15.5)
WBC: 7.7 10*3/uL (ref 4.0–10.5)
WBC: 6.6 10*3/uL (ref 4.0–10.5)
WBC: 5.6 10*3/uL (ref 4.0–10.5)
nRBC: 0 % (ref 0.0–0.2)
nRBC: 0 % (ref 0.0–0.2)
nRBC: 0 % (ref 0.0–0.2)

## 2022-03-26 LAB — CBC WITH DIFFERENTIAL/PLATELET
Abs Immature Granulocytes: 0.02 10*3/uL (ref 0.00–0.07)
Basophils Absolute: 0 10*3/uL (ref 0.0–0.1)
Basophils Relative: 0 %
Eosinophils Absolute: 0.1 10*3/uL (ref 0.0–0.5)
Eosinophils Relative: 3 %
HCT: 28.2 % — ABNORMAL LOW (ref 36.0–46.0)
Hemoglobin: 9.2 g/dL — ABNORMAL LOW (ref 12.0–15.0)
Immature Granulocytes: 0 %
Lymphocytes Relative: 39 %
Lymphs Abs: 2 10*3/uL (ref 0.7–4.0)
MCH: 30.5 pg (ref 26.0–34.0)
MCHC: 32.6 g/dL (ref 30.0–36.0)
MCV: 93.4 fL (ref 80.0–100.0)
Monocytes Absolute: 0.5 10*3/uL (ref 0.1–1.0)
Monocytes Relative: 10 %
Neutro Abs: 2.4 10*3/uL (ref 1.7–7.7)
Neutrophils Relative %: 48 %
Platelets: 202 10*3/uL (ref 150–400)
RBC: 3.02 MIL/uL — ABNORMAL LOW (ref 3.87–5.11)
RDW: 14.1 % (ref 11.5–15.5)
WBC: 5 10*3/uL (ref 4.0–10.5)
nRBC: 0 % (ref 0.0–0.2)

## 2022-03-26 LAB — FERRITIN: Ferritin: 96 ng/mL (ref 11–307)

## 2022-03-26 LAB — RESP PANEL BY RT-PCR (RSV, FLU A&B, COVID)  RVPGX2
Influenza A by PCR: NEGATIVE
Influenza B by PCR: NEGATIVE
Resp Syncytial Virus by PCR: NEGATIVE
SARS Coronavirus 2 by RT PCR: POSITIVE — AB

## 2022-03-26 LAB — COMPREHENSIVE METABOLIC PANEL
ALT: 18 U/L (ref 0–44)
AST: 23 U/L (ref 15–41)
Albumin: 3.5 g/dL (ref 3.5–5.0)
Alkaline Phosphatase: 58 U/L (ref 38–126)
Anion gap: 7 (ref 5–15)
BUN: 21 mg/dL (ref 8–23)
CO2: 26 mmol/L (ref 22–32)
Calcium: 8.4 mg/dL — ABNORMAL LOW (ref 8.9–10.3)
Chloride: 106 mmol/L (ref 98–111)
Creatinine, Ser: 0.6 mg/dL (ref 0.44–1.00)
GFR, Estimated: >60 mL/min (ref >60)
Glucose, Bld: 113 mg/dL — ABNORMAL HIGH (ref 70–99)
Potassium: 3.8 mmol/L (ref 3.5–5.1)
Sodium: 139 mmol/L (ref 135–145)
Total Bilirubin: 0.4 mg/dL (ref 0.3–1.2)
Total Protein: 5.9 g/dL — ABNORMAL LOW (ref 6.5–8.1)

## 2022-03-26 LAB — FOLATE: Folate: 19.2 ng/mL (ref >5.9)

## 2022-03-26 LAB — IRON AND TIBC
Iron: 53 ug/dL (ref 28–170)
Saturation Ratios: 20 % (ref 10.4–31.8)
TIBC: 272 ug/dL (ref 250–450)
UIBC: 219 ug/dL

## 2022-03-26 LAB — RETICULOCYTES
Immature Retic Fract: 10.1 % (ref 2.3–15.9)
RBC.: 2.73 MIL/uL — ABNORMAL LOW (ref 3.87–5.11)
Retic Count, Absolute: 39.6 10*3/uL (ref 19.0–186.0)
Retic Ct Pct: 1.5 % (ref 0.4–3.1)

## 2022-03-26 LAB — APTT: aPTT: 25 seconds (ref 24–36)

## 2022-03-26 LAB — PROTIME-INR
INR: 1 (ref 0.8–1.2)
Prothrombin Time: 13.3 seconds (ref 11.4–15.2)

## 2022-03-26 LAB — TROPONIN I (HIGH SENSITIVITY): Troponin I (High Sensitivity): 3 ng/L (ref <18)

## 2022-03-26 LAB — BRAIN NATRIURETIC PEPTIDE: B Natriuretic Peptide: 44.1 pg/mL (ref 0.0–100.0)

## 2022-03-26 LAB — ABO/RH: ABO/RH(D): O POS

## 2022-03-26 LAB — PREPARE RBC (CROSSMATCH)

## 2022-03-26 MED ORDER — SIMVASTATIN 20 MG PO TABS
40.0000 mg | ORAL_TABLET | Freq: Every day | ORAL | Status: DC
  Administered 2022-03-26 – 2022-03-27 (×2): 40 mg via ORAL
  Filled 2022-03-26 (×2): qty 2

## 2022-03-26 MED ORDER — VITAMIN B-12 100 MCG PO TABS
100.0000 ug | ORAL_TABLET | Freq: Every day | ORAL | Status: DC
  Filled 2022-03-26 (×2): qty 1

## 2022-03-26 MED ORDER — SODIUM CHLORIDE 0.9 % IV SOLN: INTRAVENOUS | Status: DC

## 2022-03-26 MED ORDER — OMEGA-3-ACID ETHYL ESTERS 1 G PO CAPS
1.0000 g | ORAL_CAPSULE | Freq: Every day | ORAL | Status: DC
  Administered 2022-03-26 – 2022-03-28 (×2): 1 g via ORAL
  Filled 2022-03-26 (×2): qty 1

## 2022-03-26 MED ORDER — SODIUM CHLORIDE 0.9 % IV BOLUS
1000.0000 mL | Freq: Once | INTRAVENOUS | Status: AC
  Administered 2022-03-26: 1000 mL via INTRAVENOUS

## 2022-03-26 MED ORDER — VITAMIN D 25 MCG (1000 UNIT) PO TABS
1000.0000 [IU] | ORAL_TABLET | Freq: Every day | ORAL | Status: DC
  Administered 2022-03-26 – 2022-03-28 (×2): 1000 [IU] via ORAL
  Filled 2022-03-26 (×2): qty 1

## 2022-03-26 MED ORDER — HYDRALAZINE HCL 20 MG/ML IJ SOLN: 5.0000 mg | INTRAMUSCULAR | Status: DC | PRN

## 2022-03-26 MED ORDER — ADULT MULTIVITAMIN W/MINERALS CH
1.0000 | ORAL_TABLET | Freq: Every day | ORAL | Status: DC
  Administered 2022-03-26 – 2022-03-28 (×2): 1 via ORAL
  Filled 2022-03-26 (×2): qty 1

## 2022-03-26 MED ORDER — SODIUM CHLORIDE 0.9% IV SOLUTION: Freq: Once | INTRAVENOUS | Status: DC

## 2022-03-26 MED ORDER — FERROUS SULFATE 325 (65 FE) MG PO TABS
325.0000 mg | ORAL_TABLET | ORAL | Status: DC
  Administered 2022-03-28: 325 mg via ORAL
  Filled 2022-03-26: qty 1

## 2022-03-26 MED ORDER — ACETAMINOPHEN 325 MG PO TABS: 650.0000 mg | ORAL_TABLET | Freq: Four times a day (QID) | ORAL | Status: DC | PRN

## 2022-03-26 MED ORDER — OYSTER SHELL CALCIUM/D3 500-5 MG-MCG PO TABS
1.0000 | ORAL_TABLET | Freq: Every day | ORAL | Status: DC
  Administered 2022-03-26 – 2022-03-28 (×2): 1 via ORAL
  Filled 2022-03-26 (×2): qty 1

## 2022-03-26 MED ORDER — PANTOPRAZOLE SODIUM 40 MG IV SOLR
40.0000 mg | Freq: Once | INTRAVENOUS | Status: AC
  Administered 2022-03-26: 40 mg via INTRAVENOUS
  Filled 2022-03-26: qty 10

## 2022-03-26 MED ORDER — MAGNESIUM OXIDE 400 MG PO TABS
400.0000 mg | ORAL_TABLET | Freq: Every day | ORAL | Status: DC
  Administered 2022-03-26 – 2022-03-28 (×2): 400 mg via ORAL
  Filled 2022-03-26 (×5): qty 1

## 2022-03-26 MED ORDER — SODIUM CHLORIDE 0.9 % IV BOLUS
500.0000 mL | Freq: Once | INTRAVENOUS | Status: AC
  Administered 2022-03-26: 500 mL via INTRAVENOUS

## 2022-03-26 MED ORDER — POLYETHYLENE GLYCOL 3350 17 GM/SCOOP PO POWD
1.0000 | Freq: Once | ORAL | Status: AC
  Administered 2022-03-26: 255 g via ORAL
  Filled 2022-03-26: qty 255

## 2022-03-26 MED ORDER — ONDANSETRON HCL 4 MG/2ML IJ SOLN: 4.0000 mg | Freq: Three times a day (TID) | INTRAMUSCULAR | Status: DC | PRN

## 2022-03-26 MED ORDER — ALBUTEROL SULFATE HFA 108 (90 BASE) MCG/ACT IN AERS: 2.0000 | INHALATION_SPRAY | RESPIRATORY_TRACT | Status: DC | PRN

## 2022-03-26 MED ORDER — VITAMIN C 500 MG PO TABS
1000.0000 mg | ORAL_TABLET | Freq: Every day | ORAL | Status: DC
  Administered 2022-03-26 – 2022-03-28 (×2): 1000 mg via ORAL
  Filled 2022-03-26 (×2): qty 2

## 2022-03-26 MED ORDER — VITAMIN A 3 MG (10000 UNIT) PO CAPS
10000.0000 [IU] | ORAL_CAPSULE | Freq: Every day | ORAL | Status: DC
  Administered 2022-03-26 – 2022-03-28 (×2): 10000 [IU] via ORAL
  Filled 2022-03-26 (×3): qty 1

## 2022-03-26 MED ORDER — LYSINE 1000 MG PO TABS: 1.0000 | ORAL_TABLET | Freq: Every day | ORAL | Status: DC

## 2022-03-26 MED ORDER — SODIUM CHLORIDE 0.9 % IV SOLN
INTRAVENOUS | Status: DC
  Administered 2022-03-26: 999 mL via INTRAVENOUS

## 2022-03-26 MED ORDER — DM-GUAIFENESIN ER 30-600 MG PO TB12: 1.0000 | ORAL_TABLET | Freq: Two times a day (BID) | ORAL | Status: DC | PRN

## 2022-03-26 NOTE — ED Notes (Signed)
Pharmacy requesting recollect on type and screen

## 2022-03-26 NOTE — H&P (View-Only) (Signed)
GI Inpatient Consult Note  Reason for Consult: Hematochezia, acute blood loss anemia, LGI bleed   Attending Requesting Consult: Dr. Fletcher Anon, MD  History of Present Illness: Misty Morales is a 77 y.o. female seen for evaluation of hematochezia/LGI bleed at the request of admitting hospitalist - Dr. Ivor Costa. Patient has a PMH of HTN and HLD. She presented to the [facility] ED this morning at 6 AM this morning for chief complaint of multiple episodes of hematochezia with associated dizziness and generalized weakness. Upon presentation to the ED, all vital signs were stable with BP 139/79, HR 83, RR 18, and O2 100% on room air. Labs were significant for hemoglobin 9.2, MCV 93.4, BUN 21, serum creatinine 0.60, INR 1.0, and negative hs-troponin 3. COVID-19 PCR was +. CT head and chest x-ray were negative. She was started on IV fluid hydration and IV Protonix. GI consulted for further evaluation and management.   Patient seen and examined this morning resting comfortably in hospital bed. She had outpatient colonoscopy with Dr. Marius Ditch last Tuesday (1/30) for positive Cologuard test which revealed one 8 mm polyp in cecum removed with cold snare and one hemostatic clip placed, one 3 mm polyp removed from cecum with cold snare, one 5 mm polyp removed from descending colon with cold snare, and two large localized AVMs with bleeding on contact found in proximal ascending colon treated with APC and injection with epinephrine. She reports after the procedure for two days she had some mild RLQ pain which was mild in severity, 3/10 on pain scale. She didn't have any rectal bleeding right after the procedure. She reports she woke up yesterday in her normal state of health and had breakfast with english muffin with cherry preservatives. She reports within 30 minutes after eating breakfast she had urgency to have a BM and noticed she just passed dark red clots mixed with small amount of stool. She went about her day and  went out to eat with her husband and some friends and felt fine. She reports she was awoken up from sleep around 0500 this morning with urgency to have a bowel movement and reports she had 4 more episodes of hematochezia. She got really weak on her felt and reports she fell coming out of the bathroom and landed on her butt and hit her head. She was promptly taken to the ED. She has no previous history of GI bleeding. She denies any fevers, chills, nausea, vomiting, or abdominal pain. She denies any upper respiratory symptoms from COVID and feels completely fine. She denies any chest pain or shortness of breath.   Summary of GI Procedures:  CSY 03/18/2022: Impression:             - The examined portion of the ileum was normal. - One 8 mm polyp in the cecum, removed with a cold snare. Resected and retrieved. Clip manufacturer: [employer]. Clip (MR safe) was placed. - One 3 mm polyp in the cecum, removed with a cold snare. Resected and retrieved. - Two colonic angiodysplastic lesions. Treated with argon plasma coagulation (APC). Injected. - One 5 mm polyp in the descending colon, removed with a cold snare. Resected and retrieved. - Non-bleeding external hemorrhoids.    Past Medical History:  Past Medical History:  Diagnosis Date   Breast swelling    right breast swelling and pain   Chills    Clostridium difficile colitis    Diarrhea    Fever    Fracture 05/14/2007  Malunion, fracture, right ulna   History of echocardiogram    Echo 4/19: mild LVH, EF 55-60, no RWMA, Gr 1 DD, mild MR, mild LAE   History of nuclear stress test    Nuclear stress test 4/19: Medium size, moderate intensity mostly fixed inferoseptal, anteroseptal and septal perfusion defect (SDS 3). This not felt to represent significant ischemia. LVEF 61% with normal wall motion. Overall a low risk study.    Hypercholesterolemia    Hypertension    Leukocytosis    probably secondary to #1, improved, needs outpatient  follow-up.   Mastitis    Right breast mastitis with septicemia, resolving.  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   Syncope 02/06/2014   Very rapid heartbeat 02/06/2014   UTI (urinary tract infection) 12/31/2012   Essential hypertension 01/06/2011   Hypercholesterolemia    Osteoporosis     Past Surgical History: Past Surgical History:  Procedure Laterality Date   arm surg  arm surgery 2010   fractured right forearm    COLONOSCOPY WITH PROPOFOL N/A 03/18/2022   Procedure: COLONOSCOPY WITH PROPOFOL;  Surgeon: Lin Landsman, MD;  Location: [place];  Service: Gastroenterology;  Laterality: N/A;   ROTATOR CUFF REPAIR  unknown    Allergies: Allergies  Allergen Reactions   Adhesive [Tape] Itching    Home Medications: (Not in a hospital admission)  Home medication reconciliation was completed with the patient.   Scheduled Inpatient Medications:    vitamin C  1,000 mg Oral Daily   calcium-vitamin D  1 tablet Oral Daily   cholecalciferol  1,000 Units Oral Daily   [START ON 03/27/2022] ferrous sulfate  325 mg Oral Once per day on Mon Wed Fri   magnesium oxide  400 mg Oral Daily   multivitamin with minerals  1 tablet Oral Daily   omega-3 acid ethyl esters  1 g Oral Daily   polyethylene glycol powder  1 Container Oral Once   simvastatin  40 mg Oral q1800   vitamin A  10,000 Units Oral Daily   [START ON 03/27/2022] vitamin B-12  100 mcg Oral Daily     Continuous Inpatient Infusions:    sodium chloride 999 mL (03/26/22 0904)    PRN Inpatient Medications:  acetaminophen, albuterol, dextromethorphan-guaiFENesin, hydrALAZINE, ondansetron (ZOFRAN) IV  Family History: family history includes Diabetes in her mother; Heart disease in her father and mother.  The patient's family history is negative for inflammatory bowel disorders, GI malignancy, or solid organ transplantation.  Social History:   reports that she has never smoked. She has never used smokeless tobacco. She reports current alcohol use. She reports that she does not use drugs. The patient denies ETOH, tobacco, or drug use.   Review of Systems: Constitutional: Weight is stable.  Eyes: No changes in vision. ENT: No oral lesions, sore throat.  GI: see HPI.  Heme/Lymph: No easy bruising.  CV: No chest pain.  GU: No hematuria.  Integumentary: No rashes.  Neuro: No headaches.  Psych: No depression/anxiety.  Endocrine: No heat/cold intolerance.  Allergic/Immunologic: No urticaria.  Resp: No cough, SOB.  Musculoskeletal: No joint swelling.    Physical Examination: BP (!) 153/75   Pulse 83   Temp 97.8 F (36.6 C) (Oral)   Resp 20   Ht '5\' 3"'$  (1.6 m)   Wt 54.9 kg   SpO2 100%   BMI 21.43 kg/m  Gen: NAD, alert and oriented x 4 HEENT: PEERLA, EOMI, Neck: supple, no JVD or thyromegaly Chest: CTA bilaterally, no wheezes, crackles, or other adventitious sounds CV: RRR, no m/g/c/r Abd: soft, ND, +BS in all four quadrants; nontender to palpation in all four quadrants, no HSM, guarding, ridigity, or rebound tenderness Ext: no edema, well perfused with 2+ pulses, Skin: no rash or lesions noted Lymph: no LAD  Data: Lab Results  Component Value Date   WBC 7.7 03/26/2022   HGB 8.6 (L) 03/26/2022   HCT 25.8 (L) 03/26/2022   MCV 93.8 03/26/2022   PLT 191 03/26/2022   Recent Labs  Lab 03/20/22 1524 03/26/22 0615 03/26/22 0904  HGB 11.5 9.2* 8.6*   Lab Results   Component Value Date   NA 139 03/26/2022   K 3.8 03/26/2022   CL 106 03/26/2022   CO2 26 03/26/2022   BUN 21 03/26/2022   CREATININE 0.60 03/26/2022   Lab Results  Component Value Date   ALT 18 03/26/2022   AST 23 03/26/2022   ALKPHOS 58 03/26/2022   BILITOT 0.4 03/26/2022   Recent Labs  Lab 03/26/22 0615  APTT 25  INR 1.0   CSY 03/18/2022: Impression:             - The examined portion of the ileum was normal. - One 8 mm polyp in the cecum, removed with a cold snare. Resected and retrieved. Clip manufacturer: [employer]. Clip (MR safe) was placed. - One 3 mm polyp in the cecum, removed with a cold snare. Resected and retrieved. - Two colonic angiodysplastic lesions. Treated with argon plasma coagulation (APC). Injected. - One 5 mm polyp in the descending colon, removed with a cold snare. Resected and retrieved. - Non-bleeding external hemorrhoids.  Assessment/Plan:  77 y/o Caucasian female with a PMH of HTN and HLD presented to the [facility] ED this morning for chief complaint of multiple episodes of hematochezia with evidence of acute blood loss anemia 2/2 LGI bleeding   LGI bleed/Hematochezia/Acute blood loss anemia - clinical presentation most c/w LGI bleed from colonic AVMs in proximal ascending colon. Recent colonoscopy procedure report reviewed from last week (1/30). H&H stable currently with no overt gastrointestinal bleeding. Hemoglobin down to 8.6 from 11.5 last week   COVID-19 + - asymptomatic   Recommendations:  - Maintain 2 large bore IVs for access - Continue to monitor serial H&H. Transfuse for Hgb <7.0.  - Continue IV Protonix for gastric protection - No signs of overt gastrointestinal bleeding  - Continue to monitor for overt gastrointestinal bleeding - Avoid NSAIDs - Continue supportive care per primary team - Advise colonoscopy tomorrow with Dr. Alice Reichert for luminal evaluation and endoscopic hemostasis  - Clear liquid diet today. Bowel prep orders  in. NPO after midnight.  - See procedure note for findings and further recommendations  - If there are signs of overt gastrointestinal bleeding, please call Dr. Alice Reichert and we might need to consider CTA versus emergent colonoscopy  I reviewed the risks (including bleeding, perforation, infection, anesthesia complications, cardiac/respiratory complications), benefits and alternatives of colonoscopy. Patient consents to proceed.  Thank you for the consult. Please call with questions or concerns.  Geanie Kenning, PA-C [facility] [phone]

## 2022-03-26 NOTE — ED Notes (Signed)
EKG completed x3 in room and will not print from monitor or export to EPIC. MD aware and RN obtaining EKG via portable monitor

## 2022-03-26 NOTE — ED Notes (Signed)
Pt transported via stretcher monitored by Thedore Mins, RN. Stable at time of departure with blood running

## 2022-03-26 NOTE — ED Provider Triage Note (Signed)
Emergency Medicine Provider Triage Evaluation Note  Misty Morales , a 77 y.o. female  was evaluated in triage.  Pt complains of rectal bleeding.  Colonoscopy 8 days ago, 2 polyps taken.  Denies anticoagulant use.  Weak and dizzy this morning resulting in fall.  Review of Systems  Positive: Rectal bleeding, weak and dizzy Negative: Chest pain, shortness of breath  Physical Exam  Ht '5\' 3"'$  (1.6 m)   Wt 54.9 kg   BMI 21.43 kg/m  Gen:   Awake, no distress   Resp:  Normal effort  MSK:   Moves extremities without difficulty  Other:  Pale  Medical Decision Making  Medically screening exam initiated at 6:08 AM.  Appropriate orders placed.  [name] was informed that the remainder of the evaluation will be completed by another provider, this initial triage assessment does not replace that evaluation, and the importance of remaining in the ED until their evaluation is complete.  77 year old female presenting with rectal bleeding.  Colonoscopy 8 days ago with polypectomy x 2.  Will obtain lab work, type and screen, CT head given fall and striking head   Paulette Blanch, MD 03/26/22 [phone]

## 2022-03-26 NOTE — ED Notes (Signed)
Blood consent signed by patient and electronically saved in pt's chart

## 2022-03-26 NOTE — ED Triage Notes (Signed)
Pt presents to ER with c/o bright red rectal bleeding that started yesterday morning, along with associated weakness, and a fall that occurred this morning appx 1hr pta. States she hit her head but denies LOC and does not take blood thinners.  Pt states her rectal bleeding has been bright red in nature.  States she had a colonoscopy a few weeks ago and was told she had some hemorrhoids and polyps.  Pt states her weakness is mostly located in her lower legs.  Pt is otherwise A&O 4 and in NAD.

## 2022-03-26 NOTE — Consult Note (Signed)
GI Inpatient Consult Note  Reason for Consult: Hematochezia, acute blood loss anemia, LGI bleed   Attending Requesting Consult: Dr. Fletcher Anon, MD  History of Present Illness: Misty Morales is a 77 y.o. female seen for evaluation of hematochezia/LGI bleed at the request of admitting hospitalist - Dr. Ivor Costa. Patient has a PMH of HTN and HLD. She presented to the [facility] ED this morning at 6 AM this morning for chief complaint of multiple episodes of hematochezia with associated dizziness and generalized weakness. Upon presentation to the ED, all vital signs were stable with BP 139/79, HR 83, RR 18, and O2 100% on room air. Labs were significant for hemoglobin 9.2, MCV 93.4, BUN 21, serum creatinine 0.60, INR 1.0, and negative hs-troponin 3. COVID-19 PCR was +. CT head and chest x-ray were negative. She was started on IV fluid hydration and IV Protonix. GI consulted for further evaluation and management.   Patient seen and examined this morning resting comfortably in hospital bed. She had outpatient colonoscopy with Dr. Marius Ditch last Tuesday (1/30) for positive Cologuard test which revealed one 8 mm polyp in cecum removed with cold snare and one hemostatic clip placed, one 3 mm polyp removed from cecum with cold snare, one 5 mm polyp removed from descending colon with cold snare, and two large localized AVMs with bleeding on contact found in proximal ascending colon treated with APC and injection with epinephrine. She reports after the procedure for two days she had some mild RLQ pain which was mild in severity, 3/10 on pain scale. She didn't have any rectal bleeding right after the procedure. She reports she woke up yesterday in her normal state of health and had breakfast with english muffin with cherry preservatives. She reports within 30 minutes after eating breakfast she had urgency to have a BM and noticed she just passed dark red clots mixed with small amount of stool. She went about her day and  went out to eat with her husband and some friends and felt fine. She reports she was awoken up from sleep around 0500 this morning with urgency to have a bowel movement and reports she had 4 more episodes of hematochezia. She got really weak on her felt and reports she fell coming out of the bathroom and landed on her butt and hit her head. She was promptly taken to the ED. She has no previous history of GI bleeding. She denies any fevers, chills, nausea, vomiting, or abdominal pain. She denies any upper respiratory symptoms from COVID and feels completely fine. She denies any chest pain or shortness of breath.   Summary of GI Procedures:  CSY 03/18/2022: Impression:             - The examined portion of the ileum was normal. - One 8 mm polyp in the cecum, removed with a cold snare. Resected and retrieved. Clip manufacturer: [employer]. Clip (MR safe) was placed. - One 3 mm polyp in the cecum, removed with a cold snare. Resected and retrieved. - Two colonic angiodysplastic lesions. Treated with argon plasma coagulation (APC). Injected. - One 5 mm polyp in the descending colon, removed with a cold snare. Resected and retrieved. - Non-bleeding external hemorrhoids.    Past Medical History:  Past Medical History:  Diagnosis Date   Breast swelling    right breast swelling and pain   Chills    Clostridium difficile colitis    Diarrhea    Fever    Fracture 05/14/2007  Malunion, fracture, right ulna   History of echocardiogram    Echo 4/19: mild LVH, EF 55-60, no RWMA, Gr 1 DD, mild MR, mild LAE   History of nuclear stress test    Nuclear stress test 4/19: Medium size, moderate intensity mostly fixed inferoseptal, anteroseptal and septal perfusion defect (SDS 3). This not felt to represent significant ischemia. LVEF 61% with normal wall motion. Overall a low risk study.    Hypercholesterolemia    Hypertension    Leukocytosis    probably secondary to #1, improved, needs outpatient  follow-up.   Mastitis    Right breast mastitis with septicemia, resolving.  Patient clinically stable on discharge   Osteoporosis     Problem List: Patient Active Problem List   Diagnosis Date Noted   Rectal bleeding 03/26/2022   Iron deficiency anemia 03/26/2022   Acute blood loss anemia 03/26/2022   Fall at home, initial encounter 03/26/2022   COVID-19 virus infection 03/26/2022   Chronic diastolic CHF (congestive heart failure) ([place]) 03/26/2022   Positive colorectal cancer screening using Cologuard test 03/18/2022   Adenomatous polyp of cecum 03/18/2022   AVM (arteriovenous malformation) of colon with hemorrhage 03/18/2022   Syncope 02/06/2014   Very rapid heartbeat 02/06/2014   UTI (urinary tract infection) 12/31/2012   Essential hypertension 01/06/2011   Hypercholesterolemia    Osteoporosis     Past Surgical History: Past Surgical History:  Procedure Laterality Date   arm surg  arm surgery 2010   fractured right forearm    COLONOSCOPY WITH PROPOFOL N/A 03/18/2022   Procedure: COLONOSCOPY WITH PROPOFOL;  Surgeon: Lin Landsman, MD;  Location: [place];  Service: Gastroenterology;  Laterality: N/A;   ROTATOR CUFF REPAIR  unknown    Allergies: Allergies  Allergen Reactions   Adhesive [Tape] Itching    Home Medications: (Not in a hospital admission)  Home medication reconciliation was completed with the patient.   Scheduled Inpatient Medications:    vitamin C  1,000 mg Oral Daily   calcium-vitamin D  1 tablet Oral Daily   cholecalciferol  1,000 Units Oral Daily   [START ON 03/27/2022] ferrous sulfate  325 mg Oral Once per day on Mon Wed Fri   magnesium oxide  400 mg Oral Daily   multivitamin with minerals  1 tablet Oral Daily   omega-3 acid ethyl esters  1 g Oral Daily   polyethylene glycol powder  1 Container Oral Once   simvastatin  40 mg Oral q1800   vitamin A  10,000 Units Oral Daily   [START ON 03/27/2022] vitamin B-12  100 mcg Oral Daily     Continuous Inpatient Infusions:    sodium chloride 999 mL (03/26/22 0904)    PRN Inpatient Medications:  acetaminophen, albuterol, dextromethorphan-guaiFENesin, hydrALAZINE, ondansetron (ZOFRAN) IV  Family History: family history includes Diabetes in her mother; Heart disease in her father and mother.  The patient's family history is negative for inflammatory bowel disorders, GI malignancy, or solid organ transplantation.  Social History:   reports that she has never smoked. She has never used smokeless tobacco. She reports current alcohol use. She reports that she does not use drugs. The patient denies ETOH, tobacco, or drug use.   Review of Systems: Constitutional: Weight is stable.  Eyes: No changes in vision. ENT: No oral lesions, sore throat.  GI: see HPI.  Heme/Lymph: No easy bruising.  CV: No chest pain.  GU: No hematuria.  Integumentary: No rashes.  Neuro: No headaches.  Psych: No depression/anxiety.  Endocrine: No heat/cold intolerance.  Allergic/Immunologic: No urticaria.  Resp: No cough, SOB.  Musculoskeletal: No joint swelling.    Physical Examination: BP (!) 153/75   Pulse 83   Temp 97.8 F (36.6 C) (Oral)   Resp 20   Ht '5\' 3"'$  (1.6 m)   Wt 54.9 kg   SpO2 100%   BMI 21.43 kg/m  Gen: NAD, alert and oriented x 4 HEENT: PEERLA, EOMI, Neck: supple, no JVD or thyromegaly Chest: CTA bilaterally, no wheezes, crackles, or other adventitious sounds CV: RRR, no m/g/c/r Abd: soft, ND, +BS in all four quadrants; nontender to palpation in all four quadrants, no HSM, guarding, ridigity, or rebound tenderness Ext: no edema, well perfused with 2+ pulses, Skin: no rash or lesions noted Lymph: no LAD  Data: Lab Results  Component Value Date   WBC 7.7 03/26/2022   HGB 8.6 (L) 03/26/2022   HCT 25.8 (L) 03/26/2022   MCV 93.8 03/26/2022   PLT 191 03/26/2022   Recent Labs  Lab 03/20/22 1524 03/26/22 0615 03/26/22 0904  HGB 11.5 9.2* 8.6*   Lab Results   Component Value Date   NA 139 03/26/2022   K 3.8 03/26/2022   CL 106 03/26/2022   CO2 26 03/26/2022   BUN 21 03/26/2022   CREATININE 0.60 03/26/2022   Lab Results  Component Value Date   ALT 18 03/26/2022   AST 23 03/26/2022   ALKPHOS 58 03/26/2022   BILITOT 0.4 03/26/2022   Recent Labs  Lab 03/26/22 0615  APTT 25  INR 1.0   CSY 03/18/2022: Impression:             - The examined portion of the ileum was normal. - One 8 mm polyp in the cecum, removed with a cold snare. Resected and retrieved. Clip manufacturer: [employer]. Clip (MR safe) was placed. - One 3 mm polyp in the cecum, removed with a cold snare. Resected and retrieved. - Two colonic angiodysplastic lesions. Treated with argon plasma coagulation (APC). Injected. - One 5 mm polyp in the descending colon, removed with a cold snare. Resected and retrieved. - Non-bleeding external hemorrhoids.  Assessment/Plan:  77 y/o Caucasian female with a PMH of HTN and HLD presented to the [facility] ED this morning for chief complaint of multiple episodes of hematochezia with evidence of acute blood loss anemia 2/2 LGI bleeding   LGI bleed/Hematochezia/Acute blood loss anemia - clinical presentation most c/w LGI bleed from colonic AVMs in proximal ascending colon. Recent colonoscopy procedure report reviewed from last week (1/30). H&H stable currently with no overt gastrointestinal bleeding. Hemoglobin down to 8.6 from 11.5 last week   COVID-19 + - asymptomatic   Recommendations:  - Maintain 2 large bore IVs for access - Continue to monitor serial H&H. Transfuse for Hgb <7.0.  - Continue IV Protonix for gastric protection - No signs of overt gastrointestinal bleeding  - Continue to monitor for overt gastrointestinal bleeding - Avoid NSAIDs - Continue supportive care per primary team - Advise colonoscopy tomorrow with Dr. Alice Reichert for luminal evaluation and endoscopic hemostasis  - Clear liquid diet today. Bowel prep orders  in. NPO after midnight.  - See procedure note for findings and further recommendations  - If there are signs of overt gastrointestinal bleeding, please call Dr. Alice Reichert and we might need to consider CTA versus emergent colonoscopy  I reviewed the risks (including bleeding, perforation, infection, anesthesia complications, cardiac/respiratory complications), benefits and alternatives of colonoscopy. Patient consents to proceed.  Thank you for the consult. Please call with questions or concerns.  Geanie Kenning, PA-C [facility] [phone]

## 2022-03-26 NOTE — ED Notes (Signed)
Messaged MD about pt's drop in hgb.

## 2022-03-26 NOTE — ED Provider Notes (Signed)
[facility] Provider Note    Event Date/Time   First MD Initiated Contact with Patient 03/26/22 0701     (approximate)   History   Rectal Bleeding, Fall, and Weakness   HPI  Misty Morales is a 77 y.o. female past medical history significant for hypertension, hyperlipidemia who presents to the emergency department with lightheadedness and blood in her stool.  Patient states that she had 1 episode of bright red blood while having a bowel movement yesterday.  Was fine throughout the day and then had 4-5 bright red blood mixed with stool that started early this morning.  Feels lightheaded and like she will pass out with weakness with standing.  Denies any upper abdominal pain or pain in her abdomen.  Denies any nausea or vomiting.  Not on anticoagulation.  Colonoscopy 8 days ago with polyps removed per patient.  Denies any alcohol use.  No daily NSAID use.  No recent antibiotic use.     Physical Exam   Triage Vital Signs: ED Triage Vitals  Enc Vitals Group     BP 03/26/22 0608 139/79     Pulse Rate 03/26/22 0608 83     Resp 03/26/22 0608 18     Temp 03/26/22 0608 97.7 F (36.5 C)     Temp Source 03/26/22 0608 Oral     SpO2 03/26/22 0608 100 %     Weight 03/26/22 0607 121 lb (54.9 kg)     Height 03/26/22 0607 '5\' 3"'$  (1.6 m)     Head Circumference --      Peak Flow --      Pain Score 03/26/22 0607 0     Pain Loc --      Pain Edu? --      Excl. in [place]? --     Most recent vital signs: Vitals:   03/26/22 0736 03/26/22 0740  BP: 135/71 (!) 78/45  Pulse: 79 91  Resp: 17   Temp:    SpO2: 98% 99%    Physical Exam Constitutional:      Appearance: She is well-developed.  HENT:     Head: Atraumatic.  Eyes:     Conjunctiva/sclera: Conjunctivae normal.  Cardiovascular:     Rate and Rhythm: Regular rhythm.  Pulmonary:     Effort: No respiratory distress.  Abdominal:     General: There is no distension.     Tenderness: There is no abdominal  tenderness.  Musculoskeletal:        General: Normal range of motion.     Cervical back: Normal range of motion.  Skin:    General: Skin is warm.     Coloration: Skin is pale.  Neurological:     Mental Status: She is alert. Mental status is at baseline.     IMPRESSION / MDM / ASSESSMENT AND PLAN / ED COURSE  I reviewed the triage vital signs and the nursing notes.  Differential diagnosis including lower GI bleed, upper GI bleed, dehydration, electrolyte abnormality, viral illness including COVID/influenza.  No tachycardic or bradycardic dysrhythmias while on cardiac telemetry.  RADIOLOGY I independently reviewed imaging, my interpretation of imaging: CT scan of the head shows no signs of intracranial hemorrhage.  Chest x-ray with no signs of pneumonia.  Read as no acute findings.  LABS (all labs ordered are listed, but only abnormal results are displayed) Labs interpreted as -  Anemia with a hemoglobin of 9.2 from baseline of 11.5 on 03/21/2022.  Patient was typed and  screened.  COVID came back positive.  No significant electrolyte abnormalities.  Labs Reviewed  RESP PANEL BY RT-PCR (RSV, FLU A&B, COVID)  RVPGX2 - Abnormal; Notable for the following components:      Result Value   SARS Coronavirus 2 by RT PCR POSITIVE (*)    All other components within normal limits  COMPREHENSIVE METABOLIC PANEL - Abnormal; Notable for the following components:   Glucose, Bld 113 (*)    Calcium 8.4 (*)    Total Protein 5.9 (*)    All other components within normal limits  CBC WITH DIFFERENTIAL/PLATELET - Abnormal; Notable for the following components:   RBC 3.02 (*)    Hemoglobin 9.2 (*)    HCT 28.2 (*)    All other components within normal limits  PROTIME-INR  TYPE AND SCREEN  TROPONIN I (HIGH SENSITIVITY)  TROPONIN I (HIGH SENSITIVITY)    TREATMENT  2 L of IV fluids, IV protonix  MDM    On reevaluation patient has had a significant drop of her hemoglobin of 2 g in the past 1  week and multiple episodes of bloody bowel movements this morning.  Upon standing patient hypotensive with a blood pressure of 78/45.  Given another 1 L of IV fluids.  Concern for symptomatic anemia.  Consulted hospitalist for admission.   PROCEDURES:  Critical Care performed: No  Procedures  Patient's presentation is most consistent with acute presentation with potential threat to life or bodily function.   MEDICATIONS ORDERED IN ED: Medications  sodium chloride 0.9 % bolus 1,000 mL (has no administration in time range)  pantoprazole (PROTONIX) injection 40 mg (has no administration in time range)  sodium chloride 0.9 % bolus 1,000 mL (1,000 mLs Intravenous New Bag/Given 03/26/22 0618)    FINAL CLINICAL IMPRESSION(S) / ED DIAGNOSES   Final diagnoses:  Rectal bleeding  Weakness  Orthostatic hypotension     Rx / DC Orders   ED Discharge Orders     None        Note:  This document was prepared using Dragon voice recognition software and may include unintentional dictation errors.   Nathaniel Man, MD 03/26/22 [phone]

## 2022-03-26 NOTE — H&P (Signed)
History and Physical    Misty Morales ZOX:[id number] DOB: 24-Dec-1945 DOA: 03/26/2022  Referring MD/NP/PA:   PCP: Lawerance Cruel, MD   Patient coming from:  The patient is coming from home.  At baseline, pt is independent for most of ADL.        Chief Complaint: Bloody diarrhea  HPI: Misty Morales is a 77 y.o. female with medical history significant of HTN, HLD, C diff, colon polyps and colon AVM, who present with bloody diarrhea  Pt had colonoscopy on 03/18/2022 by Dr. 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 Pt states that Pt has had 6 times of bloody diarrhea with bright red blood since yesterday morning.  Denies nausea, vomiting or abdominal pain.  No fever or chills.  Patient is lightheaded.  She states that she fell this morning due to lightheadedness.  No loss consciousness.  She hit her head, but no headache or neck pain. Patient does not have chest pain, cough, shortness breath.  No symptoms of UTI.  Patient has positive COVID test, breath denies any symptoms related to COVID. Patient has positive orthostatic vital signs with blood pressure 139/79 at the laying position which dropped to 78/45 at the standing position.   Data reviewed independently and ED Course: pt was found to have hemoglobin 11.5 on 03/20/2022 --> 9.2 --> 8.6, WBC 5.0, INR 1.0, electrolytes renal function okay, positive COVID PCR, temperature normal, heart rate 91, RR 17, oxygen saturation 99% on room air.  CT of head negative.  Chest x-ray negative.  Patient is placed on PCU for observation. Consulted Dr. Alice Reichert of GI.   EKG: I have personally reviewed.  Sinus rhythm, QTc 441, nonspecific T wave change.  Review of Systems:   General: no fevers, chills, no body weight gain, has fatigue HEENT: no blurry vision, hearing changes or sore throat Respiratory: no dyspnea, coughing, wheezing CV: no chest pain, no palpitations GI: no nausea, vomiting, abdominal pain, diarrhea,  constipation. Has rectal bleeding GU: no dysuria, burning on urination, increased urinary frequency, hematuria  Ext: no leg edema Neuro: no unilateral weakness, numbness, or tingling, no vision change or hearing loss. Has fall. Skin: no rash, no skin tear. MSK: No muscle spasm, no deformity, no limitation of range of movement in spin Heme: No easy bruising.  Travel history: No recent long distant travel.   Allergy:  Allergies  Allergen Reactions   Adhesive [Tape] Itching    Past Medical History:  Diagnosis Date   Breast swelling    right breast swelling and pain   Chills    Clostridium difficile colitis    Diarrhea    Fever    Fracture 05/14/2007   Malunion, fracture, right ulna   History of echocardiogram    Echo 4/19: mild LVH, EF 55-60, no RWMA, Gr 1 DD, mild MR, mild LAE   History of nuclear stress test    Nuclear stress test 4/19: Medium size, moderate intensity mostly fixed inferoseptal, anteroseptal and septal perfusion defect (SDS 3). This not felt to represent significant ischemia. LVEF 61% with normal wall motion. Overall a low risk study.    Hypercholesterolemia    Hypertension    Leukocytosis    probably secondary to #1, improved, needs outpatient follow-up.   Mastitis    Right breast mastitis with septicemia, resolving.  Patient clinically stable on discharge   Osteoporosis     Past Surgical History:  Procedure Laterality Date   arm surg  arm surgery 2010   fractured right forearm    COLONOSCOPY WITH PROPOFOL N/A 03/18/2022   Procedure: COLONOSCOPY WITH PROPOFOL;  Surgeon: Lin Landsman, MD;  Location: [facility];  Service: Gastroenterology;  Laterality: N/A;   ROTATOR CUFF REPAIR  unknown    Social History:  reports that she has never smoked. She has never used smokeless tobacco. She reports current alcohol use. She reports that she does not use drugs.  Family History:  Family History  Problem Relation Age of Onset   Heart disease Mother     Diabetes Mother    Heart disease Father      Prior to Admission medications   Medication Sig Start Date End Date Taking? Authorizing Provider  Ascorbic Acid (VITAMIN C) 1000 MG tablet Take 1,000 mg by mouth daily.    [provider]  calcium-vitamin D (OSCAL WITH D) 500-200 MG-UNIT per tablet Take 1 tablet by mouth daily. Taking 750 bid    [provider]  Cholecalciferol (VITAMIN D PO) Take 1,000 Int'l Units by mouth daily.      [provider]  Cyanocobalamin (VITAMIN B-12 PO) Take 1 tablet by mouth daily.    [provider]  Ferrous Sulfate (IRON PO) Take 1 tablet by mouth 3 (three) times a week. 3 times weekly    [provider]  Lysine 1000 MG TABS Take 1 tablet by mouth daily.    [provider]  magnesium oxide (MAG-OX) 400 MG tablet Take 400 mg by mouth daily.    [provider]  Multiple Vitamin (MULTIVITAMIN) tablet Take 1 tablet by mouth daily.      [provider]  Omega-3 Fatty Acids (FISH OIL) 1000 MG CAPS Take 1,000 mg by mouth daily.    [provider]  simvastatin (ZOCOR) 40 MG tablet TAKE 1 TABLET BY MOUTH AT  BEDTIME 02/25/21   Nahser, Wonda Cheng, MD  VITAMIN A PO Take 1 tablet by mouth daily.    [provider]    Physical Exam: Vitals:   03/26/22 1600 03/26/22 1700 03/26/22 1730 03/26/22 1810  BP: (!) 161/70 (!) 160/69 (!) 153/68 (!) 167/80  Pulse: 73 82 82 84  Resp: '17 16 16 20  '$ Temp:   98.4 F (36.9 C) 98.4 F (36.9 C)  TempSrc:    Oral  SpO2: 99% 99% 100% 100%  Weight:      Height:       General: Not in acute distress. Pale looking. HEENT:       Eyes: PERRL, EOMI, no scleral icterus.       ENT: No discharge from the ears and nose, no pharynx injection, no tonsillar enlargement.        Neck: No JVD, no bruit, no mass felt. Heme: No neck lymph node enlargement. Cardiac: S1/S2, RRR, No murmurs, No gallops or rubs. Respiratory: No rales, wheezing, rhonchi or rubs. GI:  Soft, nondistended, nontender, no rebound pain, no organomegaly, BS present. GU: No hematuria Ext: No pitting leg edema bilaterally. 1+DP/PT pulse bilaterally. Musculoskeletal: No joint deformities, No joint redness or warmth, no limitation of ROM in spin. Skin: No rashes.  Neuro: Alert, oriented X3, cranial nerves II-XII grossly intact, moves all extremities normally. Psych: Patient is not psychotic, no suicidal or hemocidal ideation.  Labs on Admission: I have personally reviewed following labs and imaging studies  CBC: Recent Labs  Lab 03/20/22 1524 03/26/22 0615 03/26/22 0904 03/26/22 1410  WBC 5.6 5.0 7.7 6.6  NEUTROABS  --  2.4  --   --   HGB 11.5 9.2* 8.6* 7.5*  HCT 34.9 28.2* 25.8* 22.8*  MCV 92 93.4 93.8 94.6  PLT 219 202 191 578   Basic Metabolic Panel: Recent Labs  Lab 03/26/22 0615  NA 139  K 3.8  CL 106  CO2 26  GLUCOSE 113*  BUN 21  CREATININE 0.60  CALCIUM 8.4*   GFR: Estimated Creatinine Clearance: 47.2 mL/min (by C-G formula based on SCr of 0.6 mg/dL). Liver Function Tests: Recent Labs  Lab 03/26/22 0615  AST 23  ALT 18  ALKPHOS 58  BILITOT 0.4  PROT 5.9*  ALBUMIN 3.5   No results for input(s): "LIPASE", "AMYLASE" in the last 168 hours. No results for input(s): "AMMONIA" in the last 168 hours. Coagulation Profile: Recent Labs  Lab 03/26/22 0615  INR 1.0   Cardiac Enzymes: No results for input(s): "CKTOTAL", "CKMB", "CKMBINDEX", "TROPONINI" in the last 168 hours. BNP (last 3 results) No results for input(s): "PROBNP" in the last 8760 hours. HbA1C: No results for input(s): "HGBA1C" in the last 72 hours. CBG: No results for input(s): "GLUCAP" in the last 168 hours. Lipid Profile: No results for input(s): "CHOL", "HDL", "LDLCALC", "TRIG", "CHOLHDL", "LDLDIRECT" in the last 72 hours. Thyroid Function Tests: No results for input(s): "TSH", "T4TOTAL", "FREET4", "T3FREE", "THYROIDAB" in the last 72 hours. Anemia Panel: Recent Labs     03/24/22 1320 03/26/22 0615  VITAMINB12 >2000*  --   FOLATE >20.0 19.2  FERRITIN 221* 96  TIBC 295 272  IRON 75 53  RETICCTPCT  --  1.5   Urine analysis: No results found for: "COLORURINE", "APPEARANCEUR", "LABSPEC", "PHURINE", "GLUCOSEU", "HGBUR", "BILIRUBINUR", "KETONESUR", "PROTEINUR", "UROBILINOGEN", "NITRITE", "LEUKOCYTESUR" Sepsis Labs: '@LABRCNTIP'$ (procalcitonin:4,lacticidven:4) ) Recent Results (from the past 240 hour(s))  Resp panel by RT-PCR (RSV, Flu A&B, Covid) Anterior Nasal Swab     Status: Abnormal   Collection Time: 03/26/22  6:15 AM   Specimen: Anterior Nasal Swab  Result Value Ref Range Status   SARS Coronavirus 2 by RT PCR POSITIVE (A) NEGATIVE Final    Comment: (NOTE) SARS-CoV-2 target nucleic acids are DETECTED.  The SARS-CoV-2 RNA is generally detectable in upper respiratory specimens during the acute phase of infection. Positive results are indicative of the presence of the identified virus, but do not rule out bacterial infection or co-infection with other pathogens not detected by the test. Clinical correlation with patient history and other diagnostic information is necessary to determine patient infection status. The expected result is Negative.  Fact Sheet for Patients: EntrepreneurPulse.com.au  Fact Sheet for Healthcare Providers: IncredibleEmployment.be  This test is not yet approved or cleared by the [place] FDA and  has been authorized for detection and/or diagnosis of SARS-CoV-2 by FDA under an Emergency Use Authorization (EUA).  This EUA will remain in effect (meaning this test can be used) for the duration of  the COVID-19 declaration under Section 564(b)(1) of the A ct, 21 U.S.C. section 360bbb-3(b)(1), unless the authorization is terminated or revoked sooner.     Influenza A by PCR NEGATIVE NEGATIVE Final   Influenza B by PCR NEGATIVE NEGATIVE Final    Comment: (NOTE) The Xpert Xpress  SARS-CoV-2/FLU/RSV plus assay is intended as an aid in the diagnosis of influenza from Nasopharyngeal swab specimens and should not be used as a sole basis for treatment. Nasal washings and aspirates are unacceptable for Xpert Xpress SARS-CoV-2/FLU/RSV testing.  Fact Sheet for Patients: EntrepreneurPulse.com.au  Fact Sheet for Healthcare Providers: IncredibleEmployment.be  This test is  not yet approved or cleared by the [place] and has been authorized for detection and/or diagnosis of SARS-CoV-2 by FDA under an Emergency Use Authorization (EUA). This EUA will remain in effect (meaning this test can be used) for the duration of the COVID-19 declaration under Section 564(b)(1) of the Act, 21 U.S.C. section 360bbb-3(b)(1), unless the authorization is terminated or revoked.     Resp Syncytial Virus by PCR NEGATIVE NEGATIVE Final    Comment: (NOTE) Fact Sheet for Patients: EntrepreneurPulse.com.au  Fact Sheet for Healthcare Providers: IncredibleEmployment.be  This test is not yet approved or cleared by the [place] FDA and has been authorized for detection and/or diagnosis of SARS-CoV-2 by FDA under an Emergency Use Authorization (EUA). This EUA will remain in effect (meaning this test can be used) for the duration of the COVID-19 declaration under Section 564(b)(1) of the Act, 21 U.S.C. section 360bbb-3(b)(1), unless the authorization is terminated or revoked.  Performed at [facility], [address], [place], [place] [address]      Radiological Exams on Admission: CT Head Wo Contrast  Result Date: 03/26/2022 CLINICAL DATA:  77 year old female status post fall. Struck head on floor. Weakness. EXAM: CT HEAD WITHOUT CONTRAST TECHNIQUE: Contiguous axial images were obtained from the base of the skull through the vertex without intravenous contrast. RADIATION DOSE REDUCTION: This exam  was performed according to the departmental dose-optimization program which includes automated exposure control, adjustment of the mA and/or kV according to patient size and/or use of iterative reconstruction technique. COMPARISON:  None Available. FINDINGS: Brain: Cerebral volume is within normal limits for age. No midline shift, ventriculomegaly, mass effect, evidence of mass lesion, intracranial hemorrhage or evidence of cortically based acute infarction. Mild for age mostly periventricular white matter hypodensity, more pronounced in the left hemisphere. Otherwise preserved gray-white differentiation. No cortical encephalomalacia identified. Vascular: Mild Calcified atherosclerosis at the skull base.6 No suspicious intracranial vascular hyperdensity. Skull: Hyperostosis of the calvarium.  No fracture identified. Sinuses/Orbits: Visualized paranasal sinuses and mastoids are clear. Other: No orbit or scalp soft tissue injury identified. IMPRESSION: 1. No acute traumatic injury identified. 2. Mild for age cerebral white matter changes, most commonly due to chronic small vessel disease. Electronically Signed   By: Genevie Ann M.D.   On: 03/26/2022 07:10   DG Chest Port 1 View  Result Date: 03/26/2022 CLINICAL DATA:  Gastrointestinal bleeding.  Fall. EXAM: PORTABLE CHEST 1 VIEW COMPARISON:  Chest x-ray January 07, 2022 FINDINGS: The heart size and mediastinal contours are within normal limits. Both lungs are clear. The visualized skeletal structures are unremarkable. IMPRESSION: No active disease. Electronically Signed   By: Dorise Bullion III M.D.   On: 03/26/2022 07:01      Assessment/Plan Principal Problem:   Rectal bleeding Active Problems:   Acute blood loss anemia   Iron deficiency anemia   Hypercholesterolemia   Essential hypertension   Fall at home, initial encounter   COVID-19 virus infection   Chronic diastolic CHF (congestive heart failure) (HCC)   Assessment and Plan:  Rectal bleeding,  acute blood loss anemia and hx of Iron deficiency anemia: Hgb 11.5 --> 9.3 --> 8.6 --> 7.5. Consulted Dr. Alice Reichert of GI.   - will place in PCU bed obs - transfuse 1unit  of blood now - IVF: 2.5L NS bolus, then at 75 mL/hr - Zofran IV for nausea - Avoid NSAIDs and SQ heparin - Maintain IV access (2 large bore IVs if possible). - Monitor closely and follow q6h  cbc, transfuse as necessary, if Hgb<7.0 - LaB: INR, PTT and type screen - check anemia panel -Continue iron supplement  Hypercholesterolemia -zocor  Essential hypertension: Patient is not taking medications. -IV hydralazine as needed  Fall at home, initial encounter: No acute injury. -Fall precaution -PT/OT after colonoscopy (not ordered yet)  COVID-19 virus infection: Asymptomatic. -As needed Mucinex and albuterol inhaler  Chronic diastolic CHF (congestive heart failure) ([place]): 2D echo on 06/11/2017 showed EF of 55-60% with grade 1 diastolic dysfunction.  Patient does not have leg edema or JVD. CHF is compensated. -Check  BNP --> 44.1       DVT ppx: SCD  Code Status: Full code  Family Communication: Yes, patient's husband  by phone  Disposition Plan:  Anticipate discharge back to previous environment  Consults called:  Consulted Dr. Alice Reichert of GI  Admission status and Level of care: Progressive:  for obs    Dispo: The patient is from: Home              Anticipated d/c is to: Home              Anticipated d/c date is: 1 day              Patient currently is not medically stable to d/c.    Severity of Illness:  The appropriate patient status for this patient is OBSERVATION. Observation status is judged to be reasonable and necessary in order to provide the required intensity of service to ensure the patient's safety. The patient's presenting symptoms, physical exam findings, and initial radiographic and laboratory data in the context of their medical condition is felt to place them at decreased risk for  further clinical deterioration. Furthermore, it is anticipated that the patient will be medically stable for discharge from the hospital within 2 midnights of admission.        Date of Service 03/26/2022    Ivor Costa [facility]   If 7PM-7AM, please contact night-coverage [email or web address] 03/26/2022, 6:55 PM

## 2022-03-26 NOTE — ED Notes (Signed)
Orthostatic VS Lying 136/76 HR 74                Sitting 135/71 HR 76 Standing 78/54 This RN at bedside, pt was able to walk around room without assistance and stated she felt light headed when standing/walking. MD notified.

## 2022-03-26 NOTE — Telephone Encounter (Signed)
Patient states she does not have any questions anymore. She states she is In the ER at this time because she has had a lot of bloody diarrhea this morning and yesterday

## 2022-03-27 ENCOUNTER — Observation Stay: Payer: Medicare Other | Admitting: Registered Nurse

## 2022-03-27 ENCOUNTER — Encounter: Payer: Self-pay | Admitting: Internal Medicine

## 2022-03-27 ENCOUNTER — Encounter
Admission: EM | Disposition: A | Discharge status: Home / Self Care | Payer: Self-pay | Source: Home / Self Care | Attending: Internal Medicine

## 2022-03-27 DIAGNOSIS — K921 Melena: Secondary | ICD-10-CM | POA: Diagnosis present

## 2022-03-27 DIAGNOSIS — I509 Heart failure, unspecified: Secondary | ICD-10-CM | POA: Diagnosis not present

## 2022-03-27 DIAGNOSIS — M810 Age-related osteoporosis without current pathological fracture: Secondary | ICD-10-CM | POA: Diagnosis present

## 2022-03-27 DIAGNOSIS — I5032 Chronic diastolic (congestive) heart failure: Secondary | ICD-10-CM | POA: Diagnosis present

## 2022-03-27 DIAGNOSIS — Z833 Family history of diabetes mellitus: Secondary | ICD-10-CM | POA: Diagnosis not present

## 2022-03-27 DIAGNOSIS — K633 Ulcer of intestine: Secondary | ICD-10-CM | POA: Diagnosis present

## 2022-03-27 DIAGNOSIS — Z8249 Family history of ischemic heart disease and other diseases of the circulatory system: Secondary | ICD-10-CM | POA: Diagnosis not present

## 2022-03-27 DIAGNOSIS — K644 Residual hemorrhoidal skin tags: Secondary | ICD-10-CM | POA: Diagnosis present

## 2022-03-27 DIAGNOSIS — K5520 Angiodysplasia of colon without hemorrhage: Secondary | ICD-10-CM | POA: Diagnosis present

## 2022-03-27 DIAGNOSIS — I110 Hypertensive heart disease with heart failure: Secondary | ICD-10-CM | POA: Diagnosis present

## 2022-03-27 DIAGNOSIS — E7800 Pure hypercholesterolemia, unspecified: Secondary | ICD-10-CM | POA: Diagnosis present

## 2022-03-27 DIAGNOSIS — Z888 Allergy status to other drugs, medicaments and biological substances status: Secondary | ICD-10-CM | POA: Diagnosis not present

## 2022-03-27 DIAGNOSIS — K625 Hemorrhage of anus and rectum: Secondary | ICD-10-CM | POA: Diagnosis not present

## 2022-03-27 DIAGNOSIS — D62 Acute posthemorrhagic anemia: Secondary | ICD-10-CM | POA: Diagnosis present

## 2022-03-27 DIAGNOSIS — I951 Orthostatic hypotension: Secondary | ICD-10-CM | POA: Diagnosis present

## 2022-03-27 DIAGNOSIS — K640 First degree hemorrhoids: Secondary | ICD-10-CM | POA: Diagnosis present

## 2022-03-27 DIAGNOSIS — U071 COVID-19: Secondary | ICD-10-CM | POA: Diagnosis present

## 2022-03-27 HISTORY — PX: COLONOSCOPY: SHX5424

## 2022-03-27 LAB — BASIC METABOLIC PANEL
Anion gap: 5 (ref 5–15)
BUN: 7 mg/dL — ABNORMAL LOW (ref 8–23)
CO2: 24 mmol/L (ref 22–32)
Calcium: 8.2 mg/dL — ABNORMAL LOW (ref 8.9–10.3)
Chloride: 110 mmol/L (ref 98–111)
Creatinine, Ser: 0.46 mg/dL (ref 0.44–1.00)
GFR, Estimated: >60 mL/min (ref >60)
Glucose, Bld: 98 mg/dL (ref 70–99)
Potassium: 3.9 mmol/L (ref 3.5–5.1)
Sodium: 139 mmol/L (ref 135–145)

## 2022-03-27 LAB — CBC
HCT: 28.1 % — ABNORMAL LOW (ref 36.0–46.0)
Hemoglobin: 9.4 g/dL — ABNORMAL LOW (ref 12.0–15.0)
MCH: 30.5 pg (ref 26.0–34.0)
MCHC: 33.5 g/dL (ref 30.0–36.0)
MCV: 91.2 fL (ref 80.0–100.0)
Platelets: 177 10*3/uL (ref 150–400)
RBC: 3.08 MIL/uL — ABNORMAL LOW (ref 3.87–5.11)
RDW: 14.4 % (ref 11.5–15.5)
WBC: 7.3 10*3/uL (ref 4.0–10.5)
nRBC: 0 % (ref 0.0–0.2)

## 2022-03-27 LAB — BPAM RBC
Blood Product Expiration Date: 202403162359
ISSUE DATE / TIME: 202402071525
Unit Type and Rh: 5100

## 2022-03-27 LAB — TYPE AND SCREEN
ABO/RH(D): O POS
Antibody Screen: NEGATIVE
Unit division: 0

## 2022-03-27 LAB — GLUCOSE, CAPILLARY: Glucose-Capillary: 100 mg/dL — ABNORMAL HIGH (ref 70–99)

## 2022-03-27 LAB — VITAMIN B12: Vitamin B-12: 1393 pg/mL — ABNORMAL HIGH (ref 180–914)

## 2022-03-27 SURGERY — COLONOSCOPY
Anesthesia: General

## 2022-03-27 MED ORDER — LIDOCAINE HCL (CARDIAC) PF 100 MG/5ML IV SOSY
PREFILLED_SYRINGE | INTRAVENOUS | Status: DC | PRN
  Administered 2022-03-27: 40 mg via INTRAVENOUS

## 2022-03-27 MED ORDER — PROPOFOL 10 MG/ML IV BOLUS
INTRAVENOUS | Status: DC | PRN
  Administered 2022-03-27: 60 mg via INTRAVENOUS
  Administered 2022-03-27: 10 mg via INTRAVENOUS

## 2022-03-27 MED ORDER — SODIUM CHLORIDE 0.9 % IV SOLN: Freq: Once | INTRAVENOUS | Status: DC

## 2022-03-27 MED ORDER — PROPOFOL 500 MG/50ML IV EMUL
INTRAVENOUS | Status: DC | PRN
  Administered 2022-03-27: 150 ug/kg/min via INTRAVENOUS

## 2022-03-27 MED ORDER — DEXMEDETOMIDINE HCL IN NACL 200 MCG/50ML IV SOLN
INTRAVENOUS | Status: DC | PRN
  Administered 2022-03-27 (×2): 4 ug via INTRAVENOUS

## 2022-03-27 NOTE — Progress Notes (Signed)
[facility] NAME: Misty Morales    MR#:  [id number]  DATE OF BIRTH:  1945/03/18  SUBJECTIVE:  patient came in with bright red blood per rectum. She had colonoscopy with polyp removal along with AVM lesions that were burned per Dr. Marius Ditch. Patient required blood transfusion. Underwent colonoscopy. No more rectal bleeding noted with prep. No family at bedside earlier. Denies any abdominal pain.    VITALS:  Blood pressure 114/78, pulse 68, temperature (!) 96.5 F (35.8 C), temperature source Temporal, resp. rate 16, height '5\' 3"'$  (1.6 m), weight 54.9 kg, SpO2 98 %.  PHYSICAL EXAMINATION:   GENERAL:  77 y.o.-year-old patient with no acute distress.  LUNGS: Normal breath sounds bilaterally, no wheezing CARDIOVASCULAR: S1, S2 normal. No murmur   ABDOMEN: Soft, nontender, nondistended. Bowel sounds present.  EXTREMITIES: No  edema b/l.    NEUROLOGIC: nonfocal  patient is alert and awake SKIN: No obvious rash, lesion, or ulcer.   LABORATORY PANEL:  CBC Recent Labs  Lab 03/27/22 0239  WBC 7.3  HGB 9.4*  HCT 28.1*  PLT 177    Chemistries  Recent Labs  Lab 03/26/22 0615 03/27/22 0239  NA 139 139  K 3.8 3.9  CL 106 110  CO2 26 24  GLUCOSE 113* 98  BUN 21 7*  CREATININE 0.60 0.46  CALCIUM 8.4* 8.2*  AST 23  --   ALT 18  --   ALKPHOS 58  --   BILITOT 0.4  --    Cardiac Enzymes No results for input(s): "TROPONINI" in the last 168 hours. RADIOLOGY:  CT Head Wo Contrast  Result Date: 03/26/2022 CLINICAL DATA:  77 year old female status post fall. Struck head on floor. Weakness. EXAM: CT HEAD WITHOUT CONTRAST TECHNIQUE: Contiguous axial images were obtained from the base of the skull through the vertex without intravenous contrast. RADIATION DOSE REDUCTION: This exam was performed according to the departmental dose-optimization program which includes automated exposure control, adjustment of the mA and/or kV according to  patient size and/or use of iterative reconstruction technique. COMPARISON:  None Available. FINDINGS: Brain: Cerebral volume is within normal limits for age. No midline shift, ventriculomegaly, mass effect, evidence of mass lesion, intracranial hemorrhage or evidence of cortically based acute infarction. Mild for age mostly periventricular white matter hypodensity, more pronounced in the left hemisphere. Otherwise preserved gray-white differentiation. No cortical encephalomalacia identified. Vascular: Mild Calcified atherosclerosis at the skull base.6 No suspicious intracranial vascular hyperdensity. Skull: Hyperostosis of the calvarium.  No fracture identified. Sinuses/Orbits: Visualized paranasal sinuses and mastoids are clear. Other: No orbit or scalp soft tissue injury identified. IMPRESSION: 1. No acute traumatic injury identified. 2. Mild for age cerebral white matter changes, most commonly due to chronic small vessel disease. Electronically Signed   By: Genevie Ann M.D.   On: 03/26/2022 07:10   DG Chest Port 1 View  Result Date: 03/26/2022 CLINICAL DATA:  Gastrointestinal bleeding.  Fall. EXAM: PORTABLE CHEST 1 VIEW COMPARISON:  Chest x-ray January 07, 2022 FINDINGS: The heart size and mediastinal contours are within normal limits. Both lungs are clear. The visualized skeletal structures are unremarkable. IMPRESSION: No active disease. Electronically Signed   By: Dorise Bullion III M.D.   On: 03/26/2022 07:01    Assessment and Plan  Misty Morales is a 77 y.o. female with medical history significant of HTN, HLD, C diff, colon polyps and colon AVM, who present with bloody diarrhea   Pt had colonoscopy on  03/18/2022 by Dr. 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 Pt states that Pt has had 6 times of bloody diarrhea with bright red blood since yesterday morning.  Denies nausea, vomiting or abdominal pain.  No fever or chills.  Patient is lightheaded. She states that  she fell this morning due to lightheadedness.  No loss consciousness.   Rectal bleeding, acute blood loss anemia and hx of Iron deficiency anemia: Hgb 11.5 --> 9.3 --> 8.6 --> 7.5--1 unit BT-- 9.4. -- Consulted Dr. Alice Reichert of GI.  - Zofran IV for nausea - Avoid NSAIDs and SQ heparin -Continue iron supplement --Colonoscopy  - Non-bleeding internal hemorrhoids.                        - Two ulcers in the proximal ascending colon. Clips                         (MR conditional) were placed. Clip manufacturer:                         [employer].                        - The examination was otherwise normal. --advance diet as tolerated   Hypercholesterolemia -zocor   Essential hypertension: Patient is not taking medications. -IV hydralazine as needed   Fall at home, initial encounter: No acute injury. -Fall precaution --MT to ambulate--pt independent   COVID-19 virus infection: Asymptomatic. -As needed Mucinex and albuterol inhaler   Chronic diastolic CHF (congestive heart failure) ([place]): 2D echo on 06/11/2017 showed EF of 55-60% with grade 1 diastolic dysfunction.  Patient does not have leg edema or JVD. CHF is compensated. -Check  BNP --> 44.1      Procedures: colonoscopy Family communication : none Consults : G.I. CODE STATUS: full DVT Prophylaxis : SCD Level of care: Progressive Status is: inpt   monitor overnight hemoglobin and any rectal bleed. If remains stable Discharge home tomorrow per G.I. TOTAL TIME TAKING CARE OF THIS PATIENT: 35 minutes.  >50% time spent on counselling and coordination of care  Note: This dictation was prepared with Dragon dictation along with smaller phrase technology. Any transcriptional errors that result from this process are unintentional.  Fritzi Mandes M.D    [facility]   CC: Primary care physician; Lawerance Cruel, MD

## 2022-03-27 NOTE — Transfer of Care (Signed)
Immediate Anesthesia Transfer of Care Note  Patient: Misty Morales  Procedure(s) Performed: Procedure(s): COLONOSCOPY (N/A)  Patient Location: PACU and Endoscopy Unit  Anesthesia Type:General  Level of Consciousness: sedated  Airway & Oxygen Therapy: Patient Spontanous Breathing and Patient connected to nasal cannula oxygen  Post-op Assessment: Report given to RN and Post -op Vital signs reviewed and stable  Post vital signs: Reviewed and stable  Last Vitals:  Vitals:   03/27/22 1100 03/27/22 1154  BP: (!) 152/115 101/63  Pulse: 82 67  Resp: 18 14  Temp:  (!) 35.8 C  SpO2: 23% 76%    Complications: No apparent anesthesia complications

## 2022-03-27 NOTE — Op Note (Signed)
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 Alice Reichert MD, MD Referring MD:          Lona Kettle (Referring MD) Medicines:             Propofol per Anesthesia Complications:         No immediate complications. Estimated blood loss: None. Procedure:             Pre-Anesthesia Assessment:                        - The risks and benefits of the procedure and the                         sedation options and risks were discussed with the                         patient. All questions were answered and informed                         consent was obtained.                        - Patient identification and proposed procedure were                         verified prior to the procedure by the nurse. The                         procedure was verified in the procedure room.                        - ASA Grade Assessment: III - A patient with severe                         systemic disease.                        - After reviewing the risks and benefits, the patient                         was deemed in satisfactory condition to undergo the                         procedure.                        After obtaining informed consent, the colonoscope was                         passed  under direct vision. Throughout the procedure,                         the patient's blood pressure, pulse, and oxygen                         saturations were monitored continuously. The                         Colonoscope was introduced through the anus and                          advanced to the the cecum, identified by appendiceal                         orifice and ileocecal valve. The colonoscopy was                         performed without difficulty. The patient tolerated                         the procedure well. The quality of the bowel                         preparation was good. The ileocecal valve, appendiceal                         orifice, and rectum were photographed. Findings:      The perianal and digital rectal examinations were normal. Pertinent       negatives include normal sphincter tone and no palpable rectal lesions.      Non-bleeding internal hemorrhoids were found during retroflexion. The       hemorrhoids were Grade I (internal hemorrhoids that do not prolapse).      Two 15-20 mm ulcers were found in the proximal ascending colon. No       bleeding was present. Stigmata of recent bleeding were present. For       hemostasis, three hemostatic clips were successfully placed (MR       conditional). Clip manufacturer: [employer]. There was no       bleeding during, or at the end, of the procedure. Estimated blood loss:       none. Estimated blood loss: none.      The exam was otherwise without abnormality. Impression:            - Non-bleeding internal hemorrhoids.                        - Two ulcers in the proximal ascending colon. Clips                         (MR conditional) were placed. Clip manufacturer:                         [employer].                        - The examination was otherwise normal.                        -  No specimens collected. Recommendation:        - Return patient to hospital ward for observation.                        - Advance diet as tolerated.                        - Serial H/H, Serial exams Procedure Code(s):     --- Professional ---                        [phone], Colonoscopy, flexible; with control of                         bleeding, any method Diagnosis Code(s):     --- Professional  ---                        K62.5, Hemorrhage of anus and rectum                        K92.2, Gastrointestinal hemorrhage, unspecified                        K55.9, Vascular disorder of intestine, unspecified                        K64.0, First degree hemorrhoids                        K63.3, Ulcer of intestine CPT copyright 2022 American Medical Association. All rights reserved. The codes documented in this report are preliminary and upon coder review may  be revised to meet current compliance requirements. Efrain Sella MD, MD 03/27/2022 12:00:02 PM This report has been signed electronically. Number of Addenda: 0 Note Initiated On: 03/27/2022 10:48 AM Scope Withdrawal Time: 0 hours 12 minutes 7 seconds  Total Procedure Duration: 0 hours 17 minutes 1 second  Estimated Blood Loss:  Estimated blood loss: none.      [facility]

## 2022-03-27 NOTE — Anesthesia Postprocedure Evaluation (Signed)
Anesthesia Post Note  Patient: Misty Morales  Procedure(s) Performed: COLONOSCOPY  Anesthesia Type: General Anesthetic complications: no   No notable events documented.   Last Vitals:  Vitals:   03/27/22 1214 03/27/22 1249  BP: 114/78 (!) 146/65  Pulse:  69  Resp:  18  Temp:  36.5 C  SpO2: 98% 96%    Last Pain:  Vitals:   03/27/22 1154  TempSrc: Temporal  PainSc:                  Alphonsus Sias

## 2022-03-27 NOTE — Plan of Care (Signed)

## 2022-03-27 NOTE — Anesthesia Postprocedure Evaluation (Signed)
Anesthesia Post Note  Patient: [name]  Procedure(s) Performed: COLONOSCOPY  Patient location during evaluation: Endoscopy Anesthesia Type: General Level of consciousness: awake and alert Pain management: pain level controlled Vital Signs Assessment: post-procedure vital signs reviewed and stable Respiratory status: spontaneous breathing, nonlabored ventilation and respiratory function stable Cardiovascular status: blood pressure returned to baseline and stable Postop Assessment: no apparent nausea or vomiting Anesthetic complications: no   No notable events documented.   Last Vitals:  Vitals:   03/27/22 1214 03/27/22 1249  BP: 114/78 (!) 146/65  Pulse:  69  Resp:  18  Temp:  36.5 C  SpO2: 98% 96%    Last Pain:  Vitals:   03/27/22 1154  TempSrc: Temporal  PainSc:                  Alphonsus Sias

## 2022-03-27 NOTE — Anesthesia Preprocedure Evaluation (Signed)
Anesthesia Evaluation  Patient identified by MRN, date of birth, ID band Patient awake    Reviewed: Allergy & Precautions, NPO status , Patient's Chart, lab work & pertinent test results  Airway Mallampati: III  TM Distance: >3 FB Neck ROM: full    Dental  (+) Chipped   Pulmonary pneumonia Pt COVID +, no symptoms   Pulmonary exam normal        Cardiovascular hypertension, +CHF  Normal cardiovascular exam  EF 55%   Neuro/Psych negative neurological ROS  negative psych ROS   GI/Hepatic negative GI ROS, Neg liver ROS,,,  Endo/Other  negative endocrine ROS    Renal/GU negative Renal ROS  negative genitourinary   Musculoskeletal   Abdominal   Peds  Hematology negative hematology ROS (+) Blood dyscrasia, anemia   Anesthesia Other Findings Past Medical History: No date: Breast swelling     Comment:  right breast swelling and pain No date: Chills No date: Clostridium difficile colitis No date: Diarrhea No date: Fever 05/14/2007: Fracture     Comment:  Malunion, fracture, right ulna No date: GI bleed No date: History of echocardiogram     Comment:  Echo 4/19: mild LVH, EF 55-60, no RWMA, Gr 1 DD, mild               MR, mild LAE No date: History of nuclear stress test     Comment:  Nuclear stress test 4/19: Medium size, moderate               intensity mostly fixed inferoseptal, anteroseptal and               septal perfusion defect (SDS 3). This not felt to               represent significant ischemia. LVEF 61% with normal wall              motion. Overall a low risk study.  No date: Hypercholesterolemia No date: Hypertension No date: Leukocytosis     Comment:  probably secondary to #1, improved, needs outpatient               follow-up. No date: Mastitis     Comment:  Right breast mastitis with septicemia, resolving.                Patient clinically stable on discharge No date: Osteoporosis  Past Surgical  History: arm surgery 2010: arm surg     Comment:  fractured right forearm  03/18/2022: COLONOSCOPY WITH PROPOFOL; N/A     Comment:  Procedure: COLONOSCOPY WITH PROPOFOL;  Surgeon: Lin Landsman, MD;  Location: [facility] ENDOSCOPY;  Service:               Gastroenterology;  Laterality: N/A; unknown: ROTATOR CUFF REPAIR  BMI    Body Mass Index: 21.43 kg/m      Reproductive/Obstetrics negative OB ROS                              Anesthesia Physical Anesthesia Plan  ASA: 2  Anesthesia Plan: General   Post-op Pain Management:    Induction: Intravenous  PONV Risk Score and Plan: Propofol infusion and TIVA  Airway Management Planned: Natural Airway and Nasal Cannula  Additional Equipment:   Intra-op Plan:   Post-operative Plan:   Informed Consent: I have reviewed the patients History  and Physical, chart, labs and discussed the procedure including the risks, benefits and alternatives for the proposed anesthesia with the patient or authorized representative who has indicated his/her understanding and acceptance.     Dental Advisory Given  Plan Discussed with: Anesthesiologist, CRNA and Surgeon  Anesthesia Plan Comments: (Patient consented for risks of anesthesia including but not limited to:  - adverse reactions to medications - risk of airway placement if required - damage to eyes, teeth, lips or other oral mucosa - nerve damage due to positioning  - sore throat or hoarseness - Damage to heart, brain, nerves, lungs, other parts of body or loss of life  Patient voiced understanding.)         Anesthesia Quick Evaluation

## 2022-03-27 NOTE — Plan of Care (Signed)
  Problem: Education: Goal: Knowledge of General Education information will improve Description: Including pain rating scale, medication(s)/side effects and non-pharmacologic comfort measures 03/27/2022 1350 by Samuella Cota, RN Outcome: Progressing 03/27/2022 1048 by Samuella Cota, RN Outcome: Progressing   Problem: Health Behavior/Discharge Planning: Goal: Ability to manage health-related needs will improve 03/27/2022 1350 by Samuella Cota, RN Outcome: Progressing 03/27/2022 1048 by Samuella Cota, RN Outcome: Progressing   Problem: Clinical Measurements: Goal: Ability to maintain clinical measurements within normal limits will improve 03/27/2022 1350 by Samuella Cota, RN Outcome: Progressing 03/27/2022 1048 by Samuella Cota, RN Outcome: Progressing Goal: Will remain free from infection 03/27/2022 1350 by Samuella Cota, RN Outcome: Progressing 03/27/2022 1048 by Samuella Cota, RN Outcome: Progressing Goal: Diagnostic test results will improve 03/27/2022 1350 by Samuella Cota, RN Outcome: Progressing 03/27/2022 1048 by Samuella Cota, RN Outcome: Progressing Goal: Respiratory complications will improve 03/27/2022 1350 by Samuella Cota, RN Outcome: Progressing 03/27/2022 1048 by Samuella Cota, RN Outcome: Progressing Goal: Cardiovascular complication will be avoided 03/27/2022 1350 by Samuella Cota, RN Outcome: Progressing 03/27/2022 1048 by Samuella Cota, RN Outcome: Progressing   Problem: Activity: Goal: Risk for activity intolerance will decrease 03/27/2022 1350 by Samuella Cota, RN Outcome: Progressing 03/27/2022 1048 by Samuella Cota, RN Outcome: Progressing   Problem: Nutrition: Goal: Adequate nutrition will be maintained 03/27/2022 1350 by Samuella Cota, RN Outcome: Progressing 03/27/2022 1048 by Samuella Cota, RN Outcome: Progressing   Problem: Coping: Goal: Level of anxiety will decrease 03/27/2022  1350 by Samuella Cota, RN Outcome: Progressing 03/27/2022 1048 by Samuella Cota, RN Outcome: Progressing   Problem: Elimination: Goal: Will not experience complications related to bowel motility 03/27/2022 1350 by Samuella Cota, RN Outcome: Progressing 03/27/2022 1048 by Samuella Cota, RN Outcome: Progressing Goal: Will not experience complications related to urinary retention 03/27/2022 1350 by Samuella Cota, RN Outcome: Progressing 03/27/2022 1048 by Samuella Cota, RN Outcome: Progressing   Problem: Pain Managment: Goal: General experience of comfort will improve 03/27/2022 1350 by Samuella Cota, RN Outcome: Progressing 03/27/2022 1048 by Samuella Cota, RN Outcome: Progressing   Problem: Safety: Goal: Ability to remain free from injury will improve 03/27/2022 1350 by Samuella Cota, RN Outcome: Progressing 03/27/2022 1048 by Samuella Cota, RN Outcome: Progressing   Problem: Skin Integrity: Goal: Risk for impaired skin integrity will decrease 03/27/2022 1350 by Samuella Cota, RN Outcome: Progressing 03/27/2022 1048 by Samuella Cota, RN Outcome: Progressing

## 2022-03-27 NOTE — Interval H&P Note (Signed)
History and Physical Interval Note:  03/27/2022 11:32 AM  Misty Morales  has presented today for surgery, with the diagnosis of Acute blood loss anemia, colonic AVMs, LGI bleeding.  The various methods of treatment have been discussed with the patient and family. After consideration of risks, benefits and other options for treatment, the patient has consented to  Procedure(s): COLONOSCOPY (N/A) as a surgical intervention.  The patient's history has been reviewed, patient examined, no change in status, stable for surgery.  I have reviewed the patient's chart and labs.  Questions were answered to the patient's satisfaction.     Sharon, [place]

## 2022-03-28 ENCOUNTER — Telehealth: Payer: Self-pay | Admitting: Gastroenterology

## 2022-03-28 ENCOUNTER — Encounter: Payer: Self-pay | Admitting: Internal Medicine

## 2022-03-28 DIAGNOSIS — U071 COVID-19: Secondary | ICD-10-CM | POA: Diagnosis not present

## 2022-03-28 DIAGNOSIS — E7800 Pure hypercholesterolemia, unspecified: Secondary | ICD-10-CM | POA: Diagnosis not present

## 2022-03-28 DIAGNOSIS — I951 Orthostatic hypotension: Secondary | ICD-10-CM | POA: Insufficient documentation

## 2022-03-28 DIAGNOSIS — K625 Hemorrhage of anus and rectum: Secondary | ICD-10-CM | POA: Diagnosis not present

## 2022-03-28 DIAGNOSIS — D62 Acute posthemorrhagic anemia: Secondary | ICD-10-CM | POA: Diagnosis not present

## 2022-03-28 LAB — CBC
HCT: 31.2 % — ABNORMAL LOW (ref 36.0–46.0)
Hemoglobin: 10.5 g/dL — ABNORMAL LOW (ref 12.0–15.0)
MCH: 30.5 pg (ref 26.0–34.0)
MCHC: 33.7 g/dL (ref 30.0–36.0)
MCV: 90.7 fL (ref 80.0–100.0)
Platelets: 191 10*3/uL (ref 150–400)
RBC: 3.44 MIL/uL — ABNORMAL LOW (ref 3.87–5.11)
RDW: 14.1 % (ref 11.5–15.5)
WBC: 4.8 10*3/uL (ref 4.0–10.5)
nRBC: 0 % (ref 0.0–0.2)

## 2022-03-28 LAB — GLUCOSE, CAPILLARY: Glucose-Capillary: 88 mg/dL (ref 70–99)

## 2022-03-28 NOTE — Telephone Encounter (Signed)
Please schedule with me, ok to follow up with me in 2 weeks  RV

## 2022-03-28 NOTE — Plan of Care (Signed)

## 2022-03-28 NOTE — Assessment & Plan Note (Signed)
Continue simvastatin.

## 2022-03-28 NOTE — Discharge Summary (Signed)
Physician Discharge Summary   Patient: Misty Morales MRN: [id number] DOB: 1945-12-25  Admit date:     03/26/2022  Discharge date: 03/28/22  Discharge Physician: Loletha Grayer   PCP: Lawerance Cruel, MD   Recommendations at discharge:  Follow-up PCP 5 days Follow-up gastroenterology Dr. Marius Ditch 2 weeks  Discharge Diagnoses: Principal Problem:   Rectal bleeding Active Problems:   Acute blood loss anemia   Iron deficiency anemia   Hypercholesterolemia   COVID-19 virus infection   Orthostatic hypotension    Hospital Course: 77 y.o. female with medical history significant of HTN, HLD, C diff, colon polyps and colon AVM, who present with bloody diarrhea   Pt had colonoscopy on 03/18/2022 by Dr. 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 Pt states that Pt has had 6 times of bloody diarrhea with bright red blood since yesterday morning.  Denies nausea, vomiting or abdominal pain.  No fever or chills.  Patient is lightheaded.  She states that she fell this morning due to lightheadedness.  No loss consciousness.  She hit her head, but no headache or neck pain. Patient does not have chest pain, cough, shortness breath.  No symptoms of UTI.  Patient has positive COVID test, breath denies any symptoms related to COVID. Patient has positive orthostatic vital signs with blood pressure 139/79 at the laying position which dropped to 78/45 at the standing position.  Dr. Alice Reichert did a colonoscopy on 2/8.  2 ulcers proximal ascending colon and 2 clips were placed.  Also hemorrhoids were seen.  With colonoscopy prep she did have yellow output.  2/9.  Hemoglobin 10.5.  Patient was feeling well.  No bowel movement since colonoscopy procedure.  Stable for discharge home and follow-up as outpatient.  Assessment and Plan: * Rectal bleeding Colonoscopy showed 2 ulcers proximal ascending colon which 2 clips were placed by Dr. Alice Reichert on 2/8.  Acute blood loss  anemia Patient received 1 unit of packed red blood cells on 03/26/2022.  Hemoglobin at that time was 7.5 and patient was orthostatic.  Hemoglobin upon discharge 10.5.  Hypercholesterolemia Continue simvastatin  COVID-19 virus infection Asymptomatic  Orthostatic hypotension On admission secondary to lower GI bleed.         Consultants: Gastroenterology Procedures performed: Colonoscopy with 2 clip placements Disposition: Home Diet recommendation:  Regular diet DISCHARGE MEDICATION: Allergies as of 03/28/2022       Reactions   Adhesive [tape] Itching        Medication List     TAKE these medications    calcium-vitamin D 500-200 MG-UNIT tablet Commonly known as: OSCAL WITH D Take 1 tablet by mouth daily. Taking 750 bid   Fish Oil 1000 MG Caps Take 1,000 mg by mouth daily.   IRON PO Take 1 tablet by mouth 3 (three) times a week. 3 times weekly   Lysine 1000 MG Tabs Take 1 tablet by mouth daily.   magnesium oxide 400 MG tablet Commonly known as: MAG-OX Take 400 mg by mouth daily.   multivitamin tablet Take 1 tablet by mouth daily.   simvastatin 40 MG tablet Commonly known as: ZOCOR TAKE 1 TABLET BY MOUTH AT  BEDTIME What changed: when to take this   VITAMIN A PO Take 1 tablet by mouth daily.   VITAMIN B-12 PO Take 1 tablet by mouth daily.   vitamin C 1000 MG tablet Take 1,000 mg by mouth daily.   VITAMIN D PO Take 1,000 Int'l Units by  mouth daily.        Follow-up Information     Lawerance Cruel, MD. Go in 2 week(s).   Specialty: Family Medicine Why: Appointment on Friday, 04/11/2022 at 11:15am. Contact information: [place] [address] [phone]         Lin Landsman, MD. Schedule an appointment as soon as possible for a visit in 2 week(s).   Specialty: Gastroenterology Why: Dr. Verlin Grills nurse will call you directly to schedule this appointment. If you have not heard from them within 48 hours, please  call them at the number listed. Contact information: [place] [address] [phone]                Discharge Exam: Danley Danker Weights   03/26/22 0607  Weight: 54.9 kg   Physical Exam HENT:     Head: Normocephalic.     Mouth/Throat:     Pharynx: No oropharyngeal exudate.  Eyes:     General: Lids are normal.     Conjunctiva/sclera: Conjunctivae normal.  Cardiovascular:     Rate and Rhythm: Normal rate and regular rhythm.     Heart sounds: Normal heart sounds, S1 normal and S2 normal.  Pulmonary:     Breath sounds: No decreased breath sounds, wheezing, rhonchi or rales.  Abdominal:     Palpations: Abdomen is soft.     Tenderness: There is no abdominal tenderness.  Musculoskeletal:     Right lower leg: No swelling.     Left lower leg: No swelling.  Skin:    General: Skin is warm.     Findings: No rash.  Neurological:     Mental Status: She is alert and oriented to person, place, and time.      Condition at discharge: stable  The results of significant diagnostics from this hospitalization (including imaging, microbiology, ancillary and laboratory) are listed below for reference.   Imaging Studies: CT Head Wo Contrast  Result Date: 03/26/2022 CLINICAL DATA:  77 year old female status post fall. Struck head on floor. Weakness. EXAM: CT HEAD WITHOUT CONTRAST TECHNIQUE: Contiguous axial images were obtained from the base of the skull through the vertex without intravenous contrast. RADIATION DOSE REDUCTION: This exam was performed according to the departmental dose-optimization program which includes automated exposure control, adjustment of the mA and/or kV according to patient size and/or use of iterative reconstruction technique. COMPARISON:  None Available. FINDINGS: Brain: Cerebral volume is within normal limits for age. No midline shift, ventriculomegaly, mass effect, evidence of mass lesion, intracranial hemorrhage or evidence of cortically based  acute infarction. Mild for age mostly periventricular white matter hypodensity, more pronounced in the left hemisphere. Otherwise preserved gray-white differentiation. No cortical encephalomalacia identified. Vascular: Mild Calcified atherosclerosis at the skull base.6 No suspicious intracranial vascular hyperdensity. Skull: Hyperostosis of the calvarium.  No fracture identified. Sinuses/Orbits: Visualized paranasal sinuses and mastoids are clear. Other: No orbit or scalp soft tissue injury identified. IMPRESSION: 1. No acute traumatic injury identified. 2. Mild for age cerebral white matter changes, most commonly due to chronic small vessel disease. Electronically Signed   By: Genevie Ann M.D.   On: 03/26/2022 07:10   DG Chest Port 1 View  Result Date: 03/26/2022 CLINICAL DATA:  Gastrointestinal bleeding.  Fall. EXAM: PORTABLE CHEST 1 VIEW COMPARISON:  Chest x-ray January 07, 2022 FINDINGS: The heart size and mediastinal contours are within normal limits. Both lungs are clear. The visualized skeletal structures are unremarkable. IMPRESSION: No active disease. Electronically Signed  By: Dorise Bullion III M.D.   On: 03/26/2022 07:01    Microbiology: Results for orders placed or performed during the hospital encounter of 03/26/22  Resp panel by RT-PCR (RSV, Flu A&B, Covid) Anterior Nasal Swab     Status: Abnormal   Collection Time: 03/26/22  6:15 AM   Specimen: Anterior Nasal Swab  Result Value Ref Range Status   SARS Coronavirus 2 by RT PCR POSITIVE (A) NEGATIVE Final    Comment: (NOTE) SARS-CoV-2 target nucleic acids are DETECTED.  The SARS-CoV-2 RNA is generally detectable in upper respiratory specimens during the acute phase of infection. Positive results are indicative of the presence of the identified virus, but do not rule out bacterial infection or co-infection with other pathogens not detected by the test. Clinical correlation with patient history and other diagnostic information is  necessary to determine patient infection status. The expected result is Negative.  Fact Sheet for Patients: EntrepreneurPulse.com.au  Fact Sheet for Healthcare Providers: IncredibleEmployment.be  This test is not yet approved or cleared by the [place] FDA and  has been authorized for detection and/or diagnosis of SARS-CoV-2 by FDA under an Emergency Use Authorization (EUA).  This EUA will remain in effect (meaning this test can be used) for the duration of  the COVID-19 declaration under Section 564(b)(1) of the A ct, 21 U.S.C. section 360bbb-3(b)(1), unless the authorization is terminated or revoked sooner.     Influenza A by PCR NEGATIVE NEGATIVE Final   Influenza B by PCR NEGATIVE NEGATIVE Final    Comment: (NOTE) The Xpert Xpress SARS-CoV-2/FLU/RSV plus assay is intended as an aid in the diagnosis of influenza from Nasopharyngeal swab specimens and should not be used as a sole basis for treatment. Nasal washings and aspirates are unacceptable for Xpert Xpress SARS-CoV-2/FLU/RSV testing.  Fact Sheet for Patients: EntrepreneurPulse.com.au  Fact Sheet for Healthcare Providers: IncredibleEmployment.be  This test is not yet approved or cleared by the [place] FDA and has been authorized for detection and/or diagnosis of SARS-CoV-2 by FDA under an Emergency Use Authorization (EUA). This EUA will remain in effect (meaning this test can be used) for the duration of the COVID-19 declaration under Section 564(b)(1) of the Act, 21 U.S.C. section 360bbb-3(b)(1), unless the authorization is terminated or revoked.     Resp Syncytial Virus by PCR NEGATIVE NEGATIVE Final    Comment: (NOTE) Fact Sheet for Patients: EntrepreneurPulse.com.au  Fact Sheet for Healthcare Providers: IncredibleEmployment.be  This test is not yet approved or cleared by the [place] FDA  and has been authorized for detection and/or diagnosis of SARS-CoV-2 by FDA under an Emergency Use Authorization (EUA). This EUA will remain in effect (meaning this test can be used) for the duration of the COVID-19 declaration under Section 564(b)(1) of the Act, 21 U.S.C. section 360bbb-3(b)(1), unless the authorization is terminated or revoked.  Performed at [facility], [address] [place], [place] [address]     Labs: CBC: Recent Labs  Lab 03/26/22 0615 03/26/22 0904 03/26/22 1410 03/26/22 2056 03/27/22 0239 03/28/22 0618  WBC 5.0 7.7 6.6 5.6 7.3 4.8  NEUTROABS 2.4  --   --   --   --   --   HGB 9.2* 8.6* 7.5* 9.7* 9.4* 10.5*  HCT 28.2* 25.8* 22.8* 28.5* 28.1* 31.2*  MCV 93.4 93.8 94.6 90.8 91.2 90.7  PLT 202 191 177 191 177 [id number]   Basic Metabolic Panel: Recent Labs  Lab 03/26/22 0615 03/27/22 0239  NA 139 139  K 3.8  3.9  CL 106 110  CO2 26 24  GLUCOSE 113* 98  BUN 21 7*  CREATININE 0.60 0.46  CALCIUM 8.4* 8.2*   Liver Function Tests: Recent Labs  Lab 03/26/22 0615  AST 23  ALT 18  ALKPHOS 58  BILITOT 0.4  PROT 5.9*  ALBUMIN 3.5   CBG: Recent Labs  Lab 03/27/22 0919 03/28/22 0906  GLUCAP 100* 88    Discharge time spent: greater than 30 minutes.  Signed: Loletha Grayer, MD [facility] 03/28/2022

## 2022-03-28 NOTE — Assessment & Plan Note (Signed)
Patient received 1 unit of packed red blood cells on 03/26/2022.  Hemoglobin at that time was 7.5 and patient was orthostatic.  Hemoglobin upon discharge 10.5.

## 2022-03-28 NOTE — Hospital Course (Signed)
77 y.o. female with medical history significant of HTN, HLD, C diff, colon polyps and colon AVM, who present with bloody diarrhea   Pt had colonoscopy on 03/18/2022 by Dr. 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 Pt states that Pt has had 6 times of bloody diarrhea with bright red blood since yesterday morning.  Denies nausea, vomiting or abdominal pain.  No fever or chills.  Patient is lightheaded.  She states that she fell this morning due to lightheadedness.  No loss consciousness.  She hit her head, but no headache or neck pain. Patient does not have chest pain, cough, shortness breath.  No symptoms of UTI.  Patient has positive COVID test, breath denies any symptoms related to COVID. Patient has positive orthostatic vital signs with blood pressure 139/79 at the laying position which dropped to 78/45 at the standing position.  Dr. Alice Reichert did a colonoscopy on 2/8.  2 ulcers proximal ascending colon and 2 clips were placed.  Also hemorrhoids were seen.  With colonoscopy prep she did have yellow output.  2/9.  Hemoglobin 10.5.  Patient was feeling well.  No bowel movement since colonoscopy procedure.  Stable for discharge home and follow-up as outpatient.

## 2022-03-28 NOTE — Assessment & Plan Note (Signed)
Asymptomatic. 

## 2022-03-28 NOTE — Assessment & Plan Note (Signed)
Colonoscopy showed 2 ulcers proximal ascending colon which 2 clips were placed by Dr. Alice Reichert on 2/8.

## 2022-03-28 NOTE — Discharge Instructions (Signed)
No anti-inflammatories (aspirin, advil, motrin, alleve, bc powder).  Only tylenol if you have a pain

## 2022-03-28 NOTE — Progress Notes (Signed)
  Transition of Care [facility]) Screening Note   Patient Details  Name: Misty Morales Date of Birth: Nov 05, 1945   Transition of Care [facility]) CM/SW Contact:    Tiburcio Bash, LCSW Phone Number: 03/28/2022, 10:48 AM    Transition of Care Department [facility]) has reviewed patient and no TOC needs have been identified at this time. We will continue to monitor patient advancement through interdisciplinary progression rounds. If new patient transition needs arise, please place a TOC consult.  Kelby Fam, [place], MSW, [place]

## 2022-03-28 NOTE — Assessment & Plan Note (Signed)
On admission secondary to lower GI bleed.

## 2022-03-28 NOTE — Telephone Encounter (Signed)
[facility] called to schedule patient a hospital follow up. Where does this patient need to be scheduled?

## 2022-04-10 ENCOUNTER — Other Ambulatory Visit: Payer: Self-pay

## 2022-04-11 DIAGNOSIS — Z9289 Personal history of other medical treatment: Secondary | ICD-10-CM | POA: Diagnosis not present

## 2022-04-11 DIAGNOSIS — Z09 Encounter for follow-up examination after completed treatment for conditions other than malignant neoplasm: Secondary | ICD-10-CM | POA: Diagnosis not present

## 2022-04-11 DIAGNOSIS — D62 Acute posthemorrhagic anemia: Secondary | ICD-10-CM | POA: Diagnosis not present

## 2022-04-11 DIAGNOSIS — I959 Hypotension, unspecified: Secondary | ICD-10-CM | POA: Diagnosis not present

## 2022-04-11 DIAGNOSIS — Z6822 Body mass index (BMI) 22.0-22.9, adult: Secondary | ICD-10-CM | POA: Diagnosis not present

## 2022-04-11 DIAGNOSIS — K922 Gastrointestinal hemorrhage, unspecified: Secondary | ICD-10-CM | POA: Diagnosis not present

## 2022-04-14 DIAGNOSIS — Z7189 Other specified counseling: Secondary | ICD-10-CM | POA: Diagnosis not present

## 2022-04-14 DIAGNOSIS — L814 Other melanin hyperpigmentation: Secondary | ICD-10-CM | POA: Diagnosis not present

## 2022-04-14 DIAGNOSIS — D225 Melanocytic nevi of trunk: Secondary | ICD-10-CM | POA: Diagnosis not present

## 2022-04-14 DIAGNOSIS — L821 Other seborrheic keratosis: Secondary | ICD-10-CM | POA: Diagnosis not present

## 2022-04-16 ENCOUNTER — Other Ambulatory Visit: Payer: Self-pay

## 2022-04-17 ENCOUNTER — Encounter: Payer: Self-pay | Admitting: Gastroenterology

## 2022-04-17 ENCOUNTER — Ambulatory Visit (INDEPENDENT_AMBULATORY_CARE_PROVIDER_SITE_OTHER): Payer: Medicare Other | Admitting: Gastroenterology

## 2022-04-17 VITALS — BP 113/66 | HR 85 | Temp 98.0°F | Ht 63.0 in | Wt 125.5 lb

## 2022-04-17 DIAGNOSIS — K5520 Angiodysplasia of colon without hemorrhage: Secondary | ICD-10-CM | POA: Diagnosis not present

## 2022-04-17 NOTE — Progress Notes (Signed)
Misty Darby, MD [address]  [place]  [place], [place] [address]  Main: [phone]  Fax: [phone]    Gastroenterology Consultation  Referring Provider:     Lawerance Cruel, MD Primary Care Physician:  Lawerance Cruel, MD Primary Gastroenterologist:  Dr. Cephas Morales Reason for Consultation: Acute blood loss anemia        HPI:   Misty Morales is a 77 y.o. female referred by Dr. Harrington Challenger, Dwyane Luo, MD  for consultation & management of acute blood loss anemia.  Patient recently underwent outpatient colonoscopy on 03/18/2022 for Cologuard positive result, 3 subcentimeter polyps were removed and she was also found to have 2 large nonbleeding AVMs in the ascending colon which were treated with APC and injected with epinephrine given the size of the AVMs. Patient's hemoglobin right after the first colonoscopy was 11.5.  Prior to this, she had normal colonoscopy as of 2015. Patient presented to [facility] ER on 03/26/2022 secondary to several episodes of hematochezia associated with lightheadedness.  She was found to have acute blood loss anemia, hemoglobin of 7.5 requiring blood transfusion.  She underwent repeat colonoscopy by Dr. Alice Reichert on 2/8, found to have area of ulcer at the site of treated AVM in the ascending colon with stigmata of recent bleeding, clips were placed.  Patient's hemoglobin was 10.5 on 2/9 on the day of discharge.  Patient is here for follow-up today.  She reports having brown bowel movements.  Denies any GI symptoms.  Taking iron pills every other day.  NSAIDs: None  Antiplts/Anticoagulants/Anti thrombotics: None  GI Procedures:  Colonoscopy 03/18/2022 Normal terminal ileum 8 mm polyp in the cecum was removed with cold snare, clip was placed 3 mm polyp in the cecum was removed with cold snare 2 large angiodysplastic lesions were found in the ascending colon, treated with APC and epinephrine was injected for hemostasis 5 mm polyp in the descending  colon was removed with cold snare Nonbleeding external hemorrhoids DIAGNOSIS:  A. COLON POLYP X 2, CECUM; BIOPSY:  - FRAGMENTS OF TUBULAR ADENOMA.  - FRAGMENTS OF COLONIC MUCOSA WITH PROMINENT LYMPHOID AGGREGATES.  - NEGATIVE FOR HIGH-GRADE DYSPLASIA AND MALIGNANCY.   B. COLON POLYP, DESCENDING; BIOPSY:  - TUBULAR ADENOMA.  - NEGATIVE FOR HIGH-GRADE DYSPLASIA AND MALIGNANCY.   Colonoscopy 03/27/2022 by Dr. Alice Reichert   Past Medical History:  Diagnosis Date   Breast swelling    right breast swelling and pain   Chills    Clostridium difficile colitis    Diarrhea    Fever    Fracture 05/14/2007   Malunion, fracture, right ulna   GI bleed    History of echocardiogram    Echo 4/19: mild LVH, EF 55-60, no RWMA, Gr 1 DD, mild MR, mild LAE   History of nuclear stress test    Nuclear stress test 4/19: Medium size, moderate intensity mostly fixed inferoseptal, anteroseptal and septal perfusion defect (SDS 3). This not felt to represent significant ischemia. LVEF 61% with normal wall motion. Overall a low risk study.    Hypercholesterolemia    Hypertension    Leukocytosis    probably secondary to #1, improved, needs outpatient follow-up.   Mastitis    Right breast mastitis with septicemia, resolving.  Patient clinically stable on discharge   Osteoporosis     Past Surgical History:  Procedure Laterality Date   arm surg  arm surgery 2010   fractured right forearm    COLONOSCOPY N/A 03/27/2022  Procedure: COLONOSCOPY;  Surgeon: Toledo, Benay Pike, MD;  Location: [facility] ENDOSCOPY;  Service: Gastroenterology;  Laterality: N/A;   COLONOSCOPY WITH PROPOFOL N/A 03/18/2022   Procedure: COLONOSCOPY WITH PROPOFOL;  Surgeon: Lin Landsman, MD;  Location: [facility] ENDOSCOPY;  Service: Gastroenterology;  Laterality: N/A;   ROTATOR CUFF REPAIR  unknown     Current Outpatient Medications:    Ascorbic Acid (VITAMIN C) 1000 MG tablet, Take 1,000 mg by mouth daily., Disp: , Rfl:    calcium-vitamin D  (OSCAL WITH D) 500-200 MG-UNIT per tablet, Take 1 tablet by mouth daily. Taking 750 bid, Disp: , Rfl:    Cholecalciferol (VITAMIN D PO), Take 1,000 Int'l Units by mouth daily.  , Disp: , Rfl:    Cyanocobalamin (VITAMIN B-12 PO), Take 1 tablet by mouth daily., Disp: , Rfl:    Ferrous Sulfate (IRON PO), Take 1 tablet by mouth 3 (three) times a week. 3 times weekly, Disp: , Rfl:    Lysine 1000 MG TABS, Take 1 tablet by mouth daily., Disp: , Rfl:    magnesium oxide (MAG-OX) 400 MG tablet, Take 400 mg by mouth daily., Disp: , Rfl:    Multiple Vitamin (MULTIVITAMIN) tablet, Take 1 tablet by mouth daily.  , Disp: , Rfl:    Omega-3 Fatty Acids (FISH OIL) 1000 MG CAPS, Take 1,000 mg by mouth daily., Disp: , Rfl:    simvastatin (ZOCOR) 40 MG tablet, TAKE 1 TABLET BY MOUTH AT  BEDTIME (Patient taking differently: Take 40 mg by mouth daily.), Disp: 90 tablet, Rfl: 3   VITAMIN A PO, Take 1 tablet by mouth daily., Disp: , Rfl:    Family History  Problem Relation Age of Onset   Heart disease Mother    Diabetes Mother    Heart disease Father      Social History   Tobacco Use   Smoking status: Never   Smokeless tobacco: Never  Vaping Use   Vaping Use: Never used  Substance Use Topics   Alcohol use: Yes    Comment: occasionally   Drug use: Never    Allergies as of 04/17/2022 - Review Complete 04/17/2022  Allergen Reaction Noted   Adhesive [tape] Itching 03/21/2014    Review of Systems:    All systems reviewed and negative except where noted in HPI.   Physical Exam:  BP 113/66 (BP Location: Left Arm, Patient Position: Sitting, Cuff Size: Normal)   Pulse 85   Temp 98 F (36.7 C) (Oral)   Ht '5\' 3"'$  (1.6 m)   Wt 125 lb 8 oz (56.9 kg)   BMI 22.23 kg/m  No LMP recorded. Patient is postmenopausal.  General:   Alert,  Well-developed, well-nourished, pleasant and cooperative in NAD Head:  Normocephalic and atraumatic. Eyes:  Sclera clear, no icterus.   Conjunctiva pink. Ears:  Normal  auditory acuity. Nose:  No deformity, discharge, or lesions. Mouth:  No deformity or lesions,oropharynx pink & moist. Neck:  Supple; no masses or thyromegaly. Lungs:  Respirations even and unlabored.  Clear throughout to auscultation.   No wheezes, crackles, or rhonchi. No acute distress. Heart:  Regular rate and rhythm; no murmurs, clicks, rubs, or gallops. Abdomen:  Normal bowel sounds. Soft, non-tender and non-distended without masses, hepatosplenomegaly or hernias noted.  No guarding or rebound tenderness.   Rectal: Not performed Msk:  Symmetrical without gross deformities. Good, equal movement & strength bilaterally. Pulses:  Normal pulses noted. Extremities:  No clubbing or edema.  No cyanosis. Neurologic:  Alert and oriented x3;  grossly normal neurologically. Skin:  Intact without significant lesions or rashes. No jaundice. Psych:  Alert and cooperative. Normal mood and affect.  Imaging Studies: Reviewed  Assessment and Plan:   KIMBELY POHLE is a 77 y.o. pleasant Caucasian female with no significant past medical history is seen in consultation for follow-up of acute blood loss anemia.  She has large AVMs in the ascending colon, treated with APC followed by bleeding from the ulcer s/p hemoclips Bleeding has resolved Recheck CBC and iron panel today Okay to stop B12 supplements given that B12 levels are elevated  Tubular adenomas of the colon Recommend surveillance colonoscopy in 03/2027   Follow up as needed   Misty Darby, MD

## 2022-04-18 LAB — CBC
Hematocrit: 34.7 % (ref 34.0–46.6)
Hemoglobin: 11.4 g/dL (ref 11.1–15.9)
MCH: 30.9 pg (ref 26.6–33.0)
MCHC: 32.9 g/dL (ref 31.5–35.7)
MCV: 94 fL (ref 79–97)
Platelets: 227 10*3/uL (ref 150–450)
RBC: 3.69 x10E6/uL — ABNORMAL LOW (ref 3.77–5.28)
RDW: 14.1 % (ref 11.7–15.4)
WBC: 4.4 10*3/uL (ref 3.4–10.8)

## 2022-04-18 LAB — IRON,TIBC AND FERRITIN PANEL
Ferritin: 82 ng/mL (ref 15–150)
Iron Saturation: 24 % (ref 15–55)
Iron: 79 ug/dL (ref 27–139)
Total Iron Binding Capacity: 328 ug/dL (ref 250–450)
UIBC: 249 ug/dL (ref 118–369)

## 2022-06-05 DIAGNOSIS — H2513 Age-related nuclear cataract, bilateral: Secondary | ICD-10-CM | POA: Diagnosis not present

## 2022-06-05 DIAGNOSIS — H25033 Anterior subcapsular polar age-related cataract, bilateral: Secondary | ICD-10-CM | POA: Diagnosis not present

## 2022-06-05 DIAGNOSIS — H25013 Cortical age-related cataract, bilateral: Secondary | ICD-10-CM | POA: Diagnosis not present

## 2022-06-05 DIAGNOSIS — H52223 Regular astigmatism, bilateral: Secondary | ICD-10-CM | POA: Diagnosis not present

## 2022-06-05 DIAGNOSIS — H524 Presbyopia: Secondary | ICD-10-CM | POA: Diagnosis not present

## 2022-06-05 DIAGNOSIS — H5213 Myopia, bilateral: Secondary | ICD-10-CM | POA: Diagnosis not present

## 2022-12-23 DIAGNOSIS — M810 Age-related osteoporosis without current pathological fracture: Secondary | ICD-10-CM | POA: Diagnosis not present

## 2022-12-23 DIAGNOSIS — Z23 Encounter for immunization: Secondary | ICD-10-CM | POA: Diagnosis not present

## 2022-12-23 DIAGNOSIS — Z6821 Body mass index (BMI) 21.0-21.9, adult: Secondary | ICD-10-CM | POA: Diagnosis not present

## 2022-12-23 DIAGNOSIS — E7800 Pure hypercholesterolemia, unspecified: Secondary | ICD-10-CM | POA: Diagnosis not present

## 2022-12-23 DIAGNOSIS — Z79899 Other long term (current) drug therapy: Secondary | ICD-10-CM | POA: Diagnosis not present

## 2022-12-23 DIAGNOSIS — Z0000 Encounter for general adult medical examination without abnormal findings: Secondary | ICD-10-CM | POA: Diagnosis not present

## 2023-01-22 NOTE — Progress Notes (Signed)
   Cardiology Office Note:    Date:  01/23/2023  ID:  ITA HOLTAN, DOB 01/05/1946, MRN [id number] PCP: Daisy Floro, MD   [facility] Providers Cardiologist:  Kristeen Miss, MD Cardiology APP:  Beatrice Lecher, PA-C       Patient Profile:      Hypertension  BP ? in 12/2020 >> Losartan DCd Hyperlipidemia  Hx of syncope 01/2014 Event monitor, echocardiogram normal Abnormal ECG Myoview 4/19: no significant ischemia; low risk Echocardiogram 4/19: EF 55-60, Gr 1 DD, mild LVH, mild MR, mild LAE  Hx of LGI bleeding 03/2022 s/p 1 u PRBCs          History of Present Illness:  Discussed the use of AI scribe software for clinical note transcription with the patient, who gave verbal consent to proceed.  Misty Morales is a 77 y.o. female, former patient of Dr. Patty Sermons, who returns for follow up of HTN, HL. She was last seen in 01/2022. She is here alone. Since Feb, she has been stable with no recurrence of bleeding. The patient remains active, walking daily and maintaining her home and garden. She discontinued playing tennis after a fall that injured her hip and due to her husband's health issues. He follows with Dr. Elberta Fortis, Dr. Gala Romney. She walks her dog - a Civil engineer, contracting. She denied experiencing chest pain, shortness of breath, or syncope.     ROS   See HPI    Studies Reviewed:       Labs Reviewed via Care Everywhere 12/24/22: LDL 94, HDL 73, Tg 89, TC 027, SCr 0.73, K 4.1, Hgb 11.2, ALT 23        Risk Assessment/Calculations:             Physical Exam:   VS:  BP 120/78   Pulse 76   Ht 5' 3.5" (1.613 m)   Wt 126 lb 3.2 oz (57.2 kg)   SpO2 96%   BMI 22.00 kg/m    Wt Readings from Last 3 Encounters:  01/23/23 126 lb 3.2 oz (57.2 kg)  04/17/22 125 lb 8 oz (56.9 kg)  03/26/22 121 lb (54.9 kg)    Constitutional:      Appearance: Healthy appearance. Not in distress.  Neck:     Vascular: No carotid bruit. JVD normal.  Pulmonary:     Breath  sounds: Normal breath sounds. No wheezing. No rales.  Cardiovascular:     Normal rate. Regular rhythm.     Murmurs: There is no murmur.  Edema:    Peripheral edema absent.  Abdominal:     Palpations: Abdomen is soft.       Assessment and Plan:   Assessment & Plan Essential hypertension Controlled w/o medication. She was taken off meds in 2022 due to low BP. Continue current management.  Hypercholesterolemia LDL optimal. Continue Simvastatin 40 mg once daily.  Syncope, unspecified syncope type Hx of syncope in 2015. Workup was negative. She has not had a recurrence.        Dispo:  Return in about 1 year (around 01/23/2024) for Routine Follow Up, w/ Tereso Newcomer, PA-C.  Signed, Tereso Newcomer, PA-C

## 2023-01-23 ENCOUNTER — Ambulatory Visit: Payer: Medicare Other | Attending: Physician Assistant | Admitting: Physician Assistant

## 2023-01-23 ENCOUNTER — Encounter: Payer: Self-pay | Admitting: Physician Assistant

## 2023-01-23 VITALS — BP 120/78 | HR 76 | Ht 63.5 in | Wt 126.2 lb

## 2023-01-23 DIAGNOSIS — E7800 Pure hypercholesterolemia, unspecified: Secondary | ICD-10-CM | POA: Diagnosis not present

## 2023-01-23 DIAGNOSIS — R55 Syncope and collapse: Secondary | ICD-10-CM

## 2023-01-23 DIAGNOSIS — I10 Essential (primary) hypertension: Secondary | ICD-10-CM

## 2023-01-23 NOTE — Assessment & Plan Note (Signed)
Controlled w/o medication. She was taken off meds in 2022 due to low BP. Continue current management.

## 2023-01-23 NOTE — Assessment & Plan Note (Signed)
LDL optimal. Continue Simvastatin 40 mg once daily.

## 2023-01-23 NOTE — Patient Instructions (Signed)
Medication Instructions:  Your physician recommends that you continue on your current medications as directed. Please refer to the Current Medication list given to you today.  *If you need a refill on your cardiac medications before your next appointment, please call your pharmacy*   Lab Work: None ordered  If you have labs (blood work) drawn today and your tests are completely normal, you will receive your results only by: MyChart Message (if you have MyChart) OR A paper copy in the mail If you have any lab test that is abnormal or we need to change your treatment, we will call you to review the results.   Testing/Procedures: None ordered   Follow-Up: At [facility], you and your health needs are our priority.  As part of our continuing mission to provide you with exceptional heart care, we have created designated Provider Care Teams.  These Care Teams include your primary Cardiologist (physician) and Advanced Practice Providers (APPs -  Physician Assistants and Nurse Practitioners) who all work together to provide you with the care you need, when you need it.  We recommend signing up for the patient portal called "MyChart".  Sign up information is provided on this After Visit Summary.  MyChart is used to connect with patients for Virtual Visits (Telemedicine).  Patients are able to view lab/test results, encounter notes, upcoming appointments, etc.  Non-urgent messages can be sent to your provider as well.   To learn more about what you can do with MyChart, go to [email or web address].    Your next appointment:   12 month(s)  Provider:   Kristeen Miss, MD  or Tereso Newcomer, PA-C         Other Instructions

## 2023-02-24 DIAGNOSIS — Z124 Encounter for screening for malignant neoplasm of cervix: Secondary | ICD-10-CM | POA: Diagnosis not present

## 2023-02-24 DIAGNOSIS — Z6822 Body mass index (BMI) 22.0-22.9, adult: Secondary | ICD-10-CM | POA: Diagnosis not present

## 2023-02-24 DIAGNOSIS — Z1231 Encounter for screening mammogram for malignant neoplasm of breast: Secondary | ICD-10-CM | POA: Diagnosis not present

## 2023-04-15 DIAGNOSIS — L821 Other seborrheic keratosis: Secondary | ICD-10-CM | POA: Diagnosis not present

## 2023-04-15 DIAGNOSIS — D225 Melanocytic nevi of trunk: Secondary | ICD-10-CM | POA: Diagnosis not present

## 2023-04-15 DIAGNOSIS — Z7189 Other specified counseling: Secondary | ICD-10-CM | POA: Diagnosis not present

## 2023-04-15 DIAGNOSIS — L814 Other melanin hyperpigmentation: Secondary | ICD-10-CM | POA: Diagnosis not present

## 2023-06-24 DIAGNOSIS — Z6822 Body mass index (BMI) 22.0-22.9, adult: Secondary | ICD-10-CM | POA: Diagnosis not present

## 2023-06-24 DIAGNOSIS — R3915 Urgency of urination: Secondary | ICD-10-CM | POA: Diagnosis not present

## 2023-06-24 DIAGNOSIS — R252 Cramp and spasm: Secondary | ICD-10-CM | POA: Diagnosis not present

## 2023-06-25 DIAGNOSIS — R3915 Urgency of urination: Secondary | ICD-10-CM | POA: Diagnosis not present

## 2023-06-30 DIAGNOSIS — H524 Presbyopia: Secondary | ICD-10-CM | POA: Diagnosis not present

## 2023-06-30 DIAGNOSIS — H2513 Age-related nuclear cataract, bilateral: Secondary | ICD-10-CM | POA: Diagnosis not present

## 2023-06-30 DIAGNOSIS — H5213 Myopia, bilateral: Secondary | ICD-10-CM | POA: Diagnosis not present

## 2023-06-30 DIAGNOSIS — H25033 Anterior subcapsular polar age-related cataract, bilateral: Secondary | ICD-10-CM | POA: Diagnosis not present

## 2023-06-30 DIAGNOSIS — H52223 Regular astigmatism, bilateral: Secondary | ICD-10-CM | POA: Diagnosis not present

## 2023-06-30 DIAGNOSIS — H25013 Cortical age-related cataract, bilateral: Secondary | ICD-10-CM | POA: Diagnosis not present

## 2023-07-28 DIAGNOSIS — N390 Urinary tract infection, site not specified: Secondary | ICD-10-CM | POA: Diagnosis not present

## 2023-07-28 DIAGNOSIS — R252 Cramp and spasm: Secondary | ICD-10-CM | POA: Diagnosis not present

## 2023-07-28 DIAGNOSIS — Z09 Encounter for follow-up examination after completed treatment for conditions other than malignant neoplasm: Secondary | ICD-10-CM | POA: Diagnosis not present

## 2023-07-28 DIAGNOSIS — Z6822 Body mass index (BMI) 22.0-22.9, adult: Secondary | ICD-10-CM | POA: Diagnosis not present

## 2024-01-07 DIAGNOSIS — E7800 Pure hypercholesterolemia, unspecified: Secondary | ICD-10-CM | POA: Diagnosis not present

## 2024-01-07 DIAGNOSIS — Z6821 Body mass index (BMI) 21.0-21.9, adult: Secondary | ICD-10-CM | POA: Diagnosis not present

## 2024-01-07 DIAGNOSIS — R252 Cramp and spasm: Secondary | ICD-10-CM | POA: Diagnosis not present

## 2024-01-07 DIAGNOSIS — M810 Age-related osteoporosis without current pathological fracture: Secondary | ICD-10-CM | POA: Diagnosis not present

## 2024-01-07 DIAGNOSIS — Z79899 Other long term (current) drug therapy: Secondary | ICD-10-CM | POA: Diagnosis not present

## 2024-01-07 DIAGNOSIS — Z23 Encounter for immunization: Secondary | ICD-10-CM | POA: Diagnosis not present

## 2024-01-07 DIAGNOSIS — Z0000 Encounter for general adult medical examination without abnormal findings: Secondary | ICD-10-CM | POA: Diagnosis not present

## 2024-01-19 ENCOUNTER — Encounter: Payer: Self-pay | Admitting: *Deleted

## 2024-01-19 NOTE — Assessment & Plan Note (Signed)
 Lipids optimal. ***

## 2024-01-19 NOTE — Progress Notes (Unsigned)
 OFFICE NOTE:    Date:  01/20/2024  ID:  Misty Morales, DOB [birth year], MRN [id number] PCP: Okey Carlin Redbird, MD  [facility] Providers Cardiologist:  Aleene Passe, MD (Inactive) Cardiology APP:  Lelon Glendia DASEN, PA-C        Hypertension  BP ? in 12/2020 >> Losartan  DCd Hyperlipidemia  Hx of syncope 01/2014 Event monitor, echocardiogram normal Abnormal ECG Myoview  4/19: no significant ischemia; low risk Echocardiogram 4/19: EF 55-60, Gr 1 DD, mild LVH, mild MR, mild LAE  Hx of LGI bleeding 03/2022 s/p 1 u PRBCs         Discussed the use of AI scribe software for clinical note transcription with the patient, who gave verbal consent to proceed. History of Present Illness Misty Morales is a 78 y.o. female for follow up of HTN, HLD. She is a former pt of Dr. Dominick and Dr. Passe. I have primarily seen her since their retirements.   She is no longer on medication for hypertension, having stopped a couple of years ago, and her blood pressures have remained normal since then. She experiences leg weakness, particularly when standing up, which she attributes to low potassium levels. Primary care prescribed potassium. This has improved her symptoms. In terms of physical activity, she has not gone back to playing tennis but engages in daily activities such as house cleaning, playing with her dog, grocery shopping, and yard work. No chest pain, shortness of breath, or syncope during these activities.    ROS-See HPI    Studies Reviewed:  EKG Interpretation Date/Time:  Wednesday January 20 2024 10:59:59 EST Ventricular Rate:  72 PR Interval:  164 QRS Duration:  72 QT Interval:  402 QTC Calculation: 440 R Axis:   8  Text Interpretation: Sinus rhythm with Premature supraventricular complexes No significant change since last tracing Confirmed by Lelon Glendia [phone]) on 01/20/2024 11:09:34 AM    LABS PCP via [facility] -  01/10/24: TC 161, Trig 93, HDL 65, LDL  78, creatinine 0.70, eGFR 87, K 4.8, ALT 17         Physical Exam:  VS:  BP 126/80 (BP Location: Left Arm, Patient Position: Sitting)   Pulse 72   Ht 5' 3 (1.6 m)   Wt 119 lb 6.4 oz (54.2 kg)   SpO2 97%   BMI 21.15 kg/m        Wt Readings from Last 3 Encounters:  01/20/24 119 lb 6.4 oz (54.2 kg)  01/23/23 126 lb 3.2 oz (57.2 kg)  04/17/22 125 lb 8 oz (56.9 kg)    Constitutional:      Appearance: Healthy appearance. Not in distress.  Neck:     Vascular: No carotid bruit. JVD normal.  Pulmonary:     Breath sounds: Normal breath sounds. No wheezing. No rales.  Cardiovascular:     Normal rate. Regular rhythm.     Murmurs: There is no murmur.     Comments: DP/PT 2+ bilat Edema:    Peripheral edema absent.  Abdominal:     Palpations: Abdomen is soft.       Assessment and Plan:    Assessment & Plan Essential hypertension Hypertension is well-controlled without medication. Blood pressure remains normal since discontinuation of medication a couple of years ago. Hypercholesterolemia Cholesterol levels are well-managed with simvastatin  40 mg daily. Recent blood work shows optimal cholesterol levels.         Dispo:  Return in about 1 year (around 01/19/2025) for Routine Follow  Up, w/ Glendia Ferrier, PA-C.  Signed, Glendia Ferrier, PA-C

## 2024-01-20 ENCOUNTER — Ambulatory Visit: Attending: Cardiology | Admitting: Physician Assistant

## 2024-01-20 ENCOUNTER — Encounter: Payer: Self-pay | Admitting: Physician Assistant

## 2024-01-20 VITALS — BP 126/80 | HR 72 | Ht 63.0 in | Wt 119.4 lb

## 2024-01-20 DIAGNOSIS — I10 Essential (primary) hypertension: Secondary | ICD-10-CM | POA: Diagnosis present

## 2024-01-20 DIAGNOSIS — E7800 Pure hypercholesterolemia, unspecified: Secondary | ICD-10-CM | POA: Insufficient documentation

## 2024-01-20 NOTE — Patient Instructions (Signed)
 Medication Instructions:  Your physician recommends that you continue on your current medications as directed. Please refer to the Current Medication list given to you today.  *If you need a refill on your cardiac medications before your next appointment, please call your pharmacy*  Lab Work: NONE ordered at this time of appointment   Testing/Procedures: NONE ordered at this time of appointment   Follow-Up: At [facility], you and your health needs are our priority.  As part of our continuing mission to provide you with exceptional heart care, our providers are all part of one team.  This team includes your primary Cardiologist (physician) and Advanced Practice Providers or APPs (Physician Assistants and Nurse Practitioners) who all work together to provide you with the care you need, when you need it.  Your next appointment:   1 year(s)  Provider:   Glendia Ferrier, PA-C          We recommend signing up for the patient portal called MyChart.  Sign up information is provided on this After Visit Summary.  MyChart is used to connect with patients for Virtual Visits (Telemedicine).  Patients are able to view lab/test results, encounter notes, upcoming appointments, etc.  Non-urgent messages can be sent to your provider as well.   To learn more about what you can do with MyChart, go to [email or web address].

## 2024-01-20 NOTE — Assessment & Plan Note (Signed)
 Hypertension is well-controlled without medication. Blood pressure remains normal since discontinuation of medication a couple of years ago.

## 2024-03-23 ENCOUNTER — Telehealth: Payer: Self-pay | Admitting: Family Medicine

## 2024-03-23 NOTE — Telephone Encounter (Signed)
 Copied from CRM #[id number]. Topic: Appointments - Scheduling Inquiry for Clinic >> Mar 23, 2024 10:45 AM Berneda FALCON wrote: Reason for CRM: patient of Dr. Daniel Bensimhon with [facility], can reach his heart doctor at  for the [place] location is [phone] and [place] location [phone]  recommended for patient to call [facility] [place] if patient can be establish care with [facility] or Dr. Cleatus. Dr. Cleatus responded saying he was unable to take them on at this time. Would they be able to schedule with Dr. Avelina?   And patient spouse Alliana Mcauliff  would like to see the same provider also   Patient callback is [phone]

## 2024-03-23 NOTE — Telephone Encounter (Signed)
 Not sure if this was meant for me. Was this supposed to go to primary care provider? Glendia Ferrier, PA-C    03/23/2024 1:31 PM

## 2024-03-25 ENCOUNTER — Telehealth: Payer: Self-pay | Admitting: Family Medicine

## 2024-03-25 NOTE — Telephone Encounter (Signed)
 Called patient let her know neither provider is accepting new patient at this time she declined to be scheduled with provider in our office that is. She will call back at later time to get set up is she changes her mind.

## 2024-03-25 NOTE — Telephone Encounter (Signed)
 Pt called in stated that she would like to be a pt of [facility] pt would like a call back

## 2024-03-25 NOTE — Telephone Encounter (Signed)
 Called and schedule pt for new pt

## 2024-03-25 NOTE — Telephone Encounter (Signed)
 Pt called in and stated that she would like to be a new pt of [facility] and would like to get the same day appt as her husband new pt appt

## 2024-06-02 ENCOUNTER — Ambulatory Visit: Admitting: Family Medicine
